# Patient Record
Sex: Female | Born: 1970
Health system: Southern US, Community
[De-identification: ages and names within clinical notes are randomized; demographics above are authoritative.]

## PROBLEM LIST (undated history)

## (undated) DIAGNOSIS — F329 Major depressive disorder, single episode, unspecified: Secondary | ICD-10-CM

## (undated) DIAGNOSIS — F32A Depression, unspecified: Secondary | ICD-10-CM

## (undated) DIAGNOSIS — Z87442 Personal history of urinary calculi: Secondary | ICD-10-CM

## (undated) DIAGNOSIS — F988 Other specified behavioral and emotional disorders with onset usually occurring in childhood and adolescence: Secondary | ICD-10-CM

## (undated) DIAGNOSIS — B0089 Other herpesviral infection: Secondary | ICD-10-CM

## (undated) DIAGNOSIS — F419 Anxiety disorder, unspecified: Secondary | ICD-10-CM

## (undated) DIAGNOSIS — IMO0002 Reserved for concepts with insufficient information to code with codable children: Secondary | ICD-10-CM

## (undated) DIAGNOSIS — R87619 Unspecified abnormal cytological findings in specimens from cervix uteri: Secondary | ICD-10-CM

## (undated) DIAGNOSIS — G43909 Migraine, unspecified, not intractable, without status migrainosus: Secondary | ICD-10-CM

## (undated) DIAGNOSIS — E785 Hyperlipidemia, unspecified: Secondary | ICD-10-CM

## (undated) DIAGNOSIS — N189 Chronic kidney disease, unspecified: Secondary | ICD-10-CM

## (undated) HISTORY — DX: Reserved for concepts with insufficient information to code with codable children: IMO0002

## (undated) HISTORY — DX: Migraine, unspecified, not intractable, without status migrainosus: G43.909

## (undated) HISTORY — DX: Other herpesviral infection: B00.89

## (undated) HISTORY — DX: Chronic kidney disease, unspecified: N18.9

## (undated) HISTORY — PX: ADENOIDECTOMY: SUR15

## (undated) HISTORY — DX: Hyperlipidemia, unspecified: E78.5

## (undated) HISTORY — DX: Unspecified abnormal cytological findings in specimens from cervix uteri: R87.619

## (undated) HISTORY — DX: Other specified behavioral and emotional disorders with onset usually occurring in childhood and adolescence: F98.8

---

## 1994-10-05 HISTORY — PX: LEEP: SHX91

## 1998-02-16 ENCOUNTER — Ambulatory Visit (HOSPITAL_COMMUNITY): Admission: RE | Admit: 1998-02-16 | Discharge: 1998-02-16 | Payer: Self-pay | Admitting: *Deleted

## 1998-11-05 ENCOUNTER — Other Ambulatory Visit: Admission: RE | Admit: 1998-11-05 | Discharge: 1998-11-05 | Payer: Self-pay | Admitting: *Deleted

## 1999-07-01 ENCOUNTER — Emergency Department (HOSPITAL_COMMUNITY): Admission: EM | Admit: 1999-07-01 | Discharge: 1999-07-01 | Payer: Self-pay | Admitting: Emergency Medicine

## 1999-07-01 ENCOUNTER — Encounter: Payer: Self-pay | Admitting: Emergency Medicine

## 1999-09-20 ENCOUNTER — Inpatient Hospital Stay (HOSPITAL_COMMUNITY): Admission: EM | Admit: 1999-09-20 | Discharge: 1999-09-22 | Payer: Self-pay | Admitting: Emergency Medicine

## 1999-09-23 ENCOUNTER — Other Ambulatory Visit (HOSPITAL_COMMUNITY): Admission: RE | Admit: 1999-09-23 | Discharge: 1999-10-27 | Payer: Self-pay | Admitting: Psychiatry

## 2001-01-18 ENCOUNTER — Other Ambulatory Visit: Admission: RE | Admit: 2001-01-18 | Discharge: 2001-01-18 | Payer: Self-pay | Admitting: Internal Medicine

## 2002-03-27 ENCOUNTER — Other Ambulatory Visit: Admission: RE | Admit: 2002-03-27 | Discharge: 2002-03-27 | Payer: Self-pay | Admitting: Internal Medicine

## 2002-10-24 ENCOUNTER — Encounter: Payer: Self-pay | Admitting: Emergency Medicine

## 2002-10-24 ENCOUNTER — Emergency Department (HOSPITAL_COMMUNITY): Admission: EM | Admit: 2002-10-24 | Discharge: 2002-10-24 | Payer: Self-pay | Admitting: Emergency Medicine

## 2003-05-14 ENCOUNTER — Other Ambulatory Visit: Admission: RE | Admit: 2003-05-14 | Discharge: 2003-05-14 | Payer: Self-pay | Admitting: Internal Medicine

## 2003-10-06 HISTORY — PX: TUBAL LIGATION: SHX77

## 2004-10-07 ENCOUNTER — Emergency Department (HOSPITAL_COMMUNITY): Admission: EM | Admit: 2004-10-07 | Discharge: 2004-10-07 | Payer: Self-pay | Admitting: Emergency Medicine

## 2004-11-06 ENCOUNTER — Other Ambulatory Visit: Admission: RE | Admit: 2004-11-06 | Discharge: 2004-11-06 | Payer: Self-pay | Admitting: Internal Medicine

## 2004-11-11 ENCOUNTER — Ambulatory Visit (HOSPITAL_COMMUNITY): Admission: RE | Admit: 2004-11-11 | Discharge: 2004-11-11 | Payer: Self-pay | Admitting: *Deleted

## 2005-09-10 IMAGING — CT CT ABDOMEN W/O CM
1 of 2 series · 15 of 32 positions shown, 19 images · non-contrast
Comparison: none

CLINICAL DATA: Right flank pain.
 CT OF THE ABDOMEN AND PELVIS WITHOUT CONTRAST, STONE PROTOCOL:
TECHNIQUE: Multidetector helical study performed without IV and without oral contrast with the renal stone protocol.
 CT ABDOMEN:
 There are no renal calculi.  There is no hydronephrosis.  There are no perinephric edematous changes.  The abdominal aorta is normal in size.  There is no adenopathy.

[Series 5: — · axial · 0.61mm/px · z∈[-492,-140]mm · 15 of 96 slices shown, 19 images]
[im 4/96  soft-tissue]
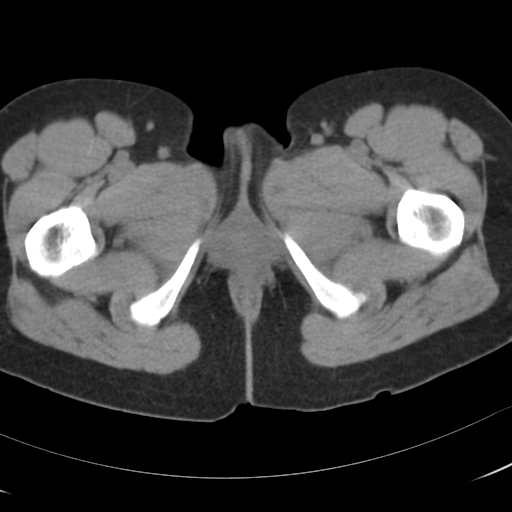
[im 4/96  bone]
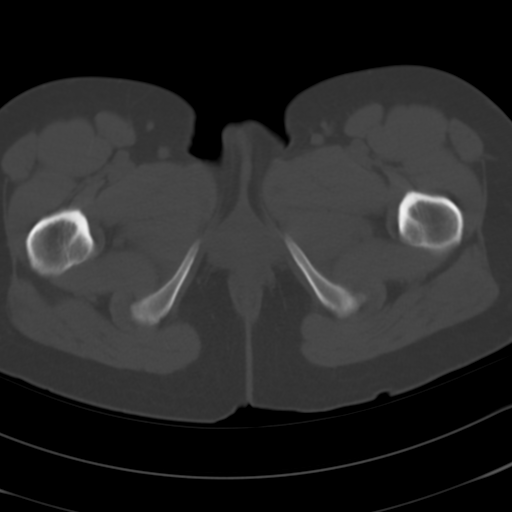
[im 11/96  soft-tissue]
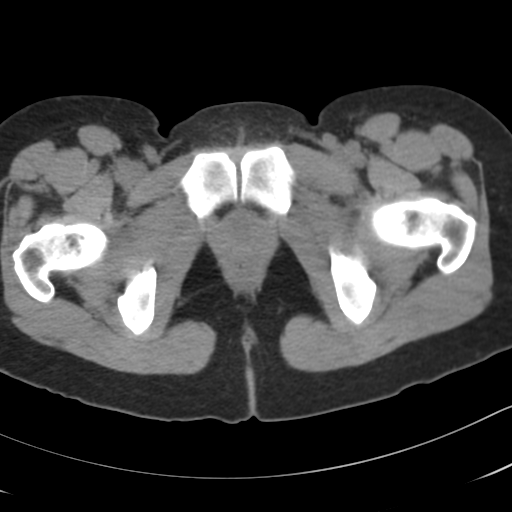
[im 19/96  soft-tissue]
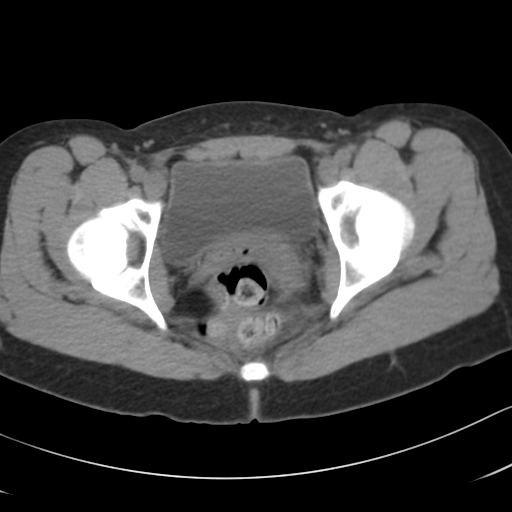
[im 26/96  soft-tissue]
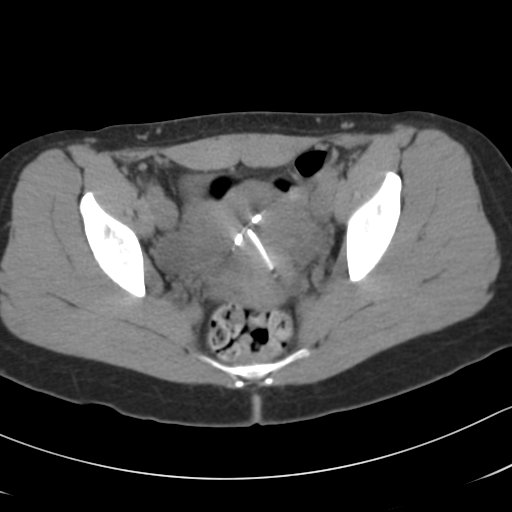
[im 33/96  soft-tissue]
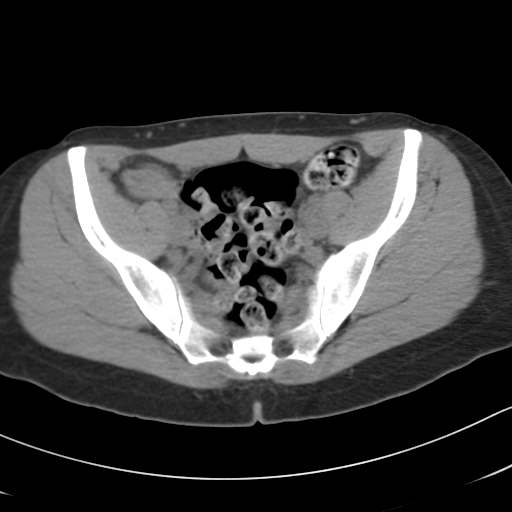
[im 41/96  soft-tissue]
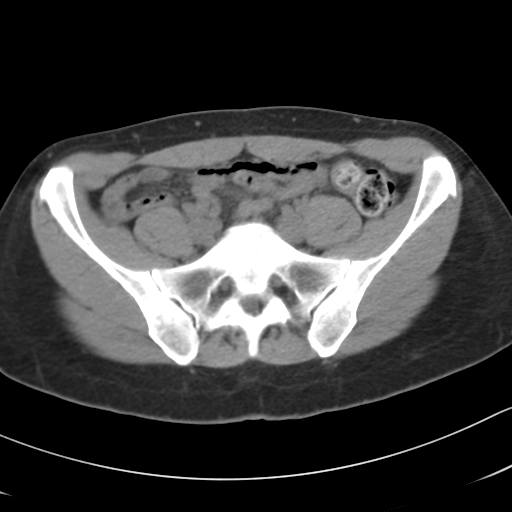
[im 48/96  soft-tissue]
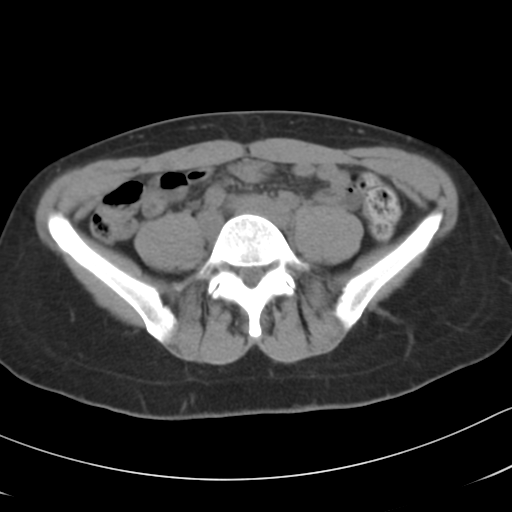
[im 55/96  soft-tissue]
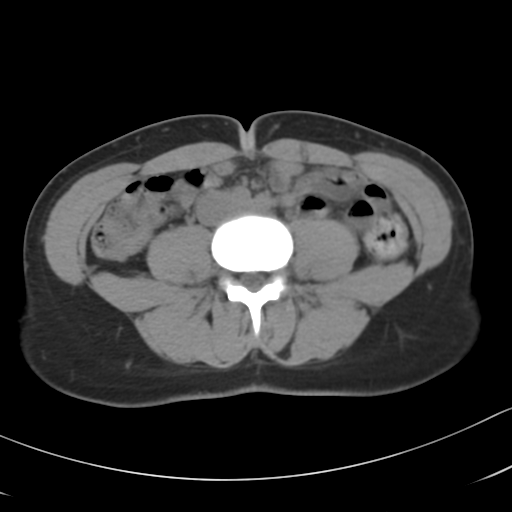
[im 63/96  soft-tissue]
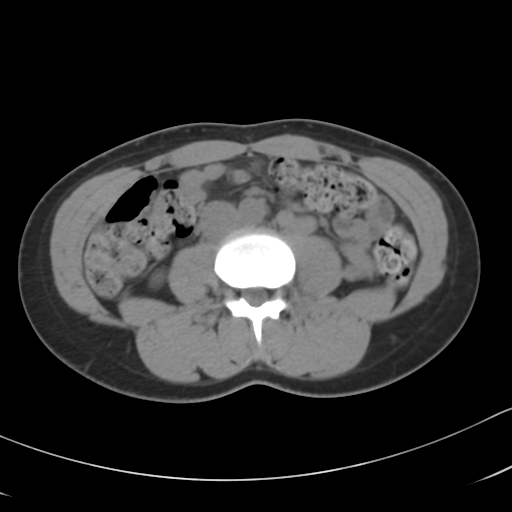
[im 63/96  bone]
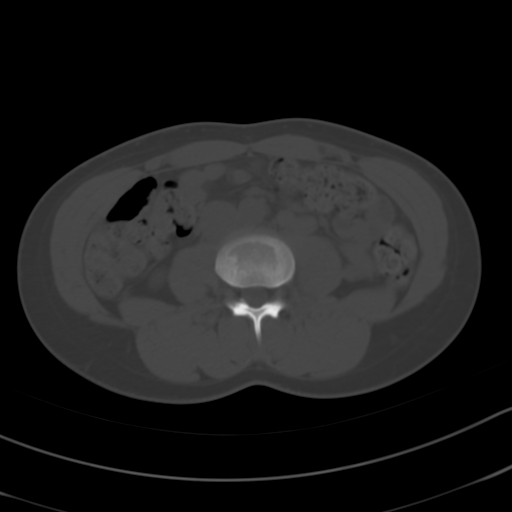
[im 70/96  soft-tissue]
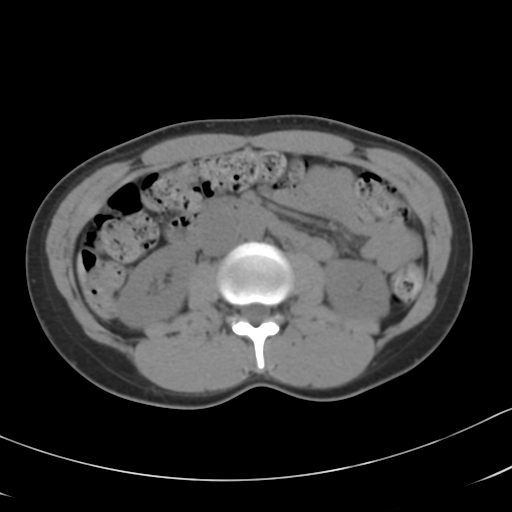
[im 77/96  soft-tissue]
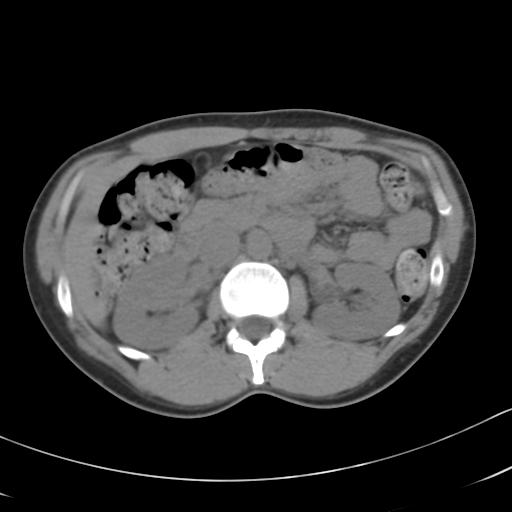
[im 81/96  lung]
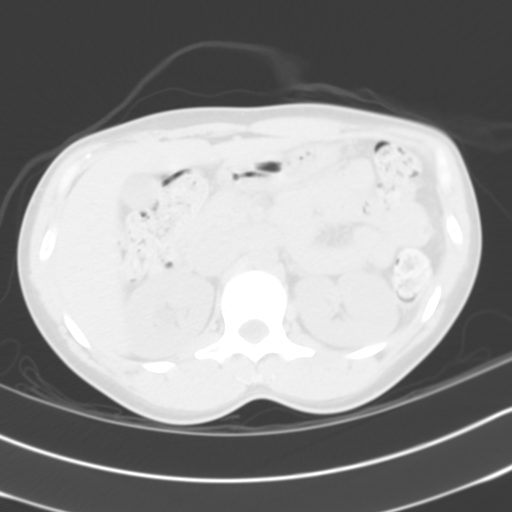
[im 85/96  soft-tissue]
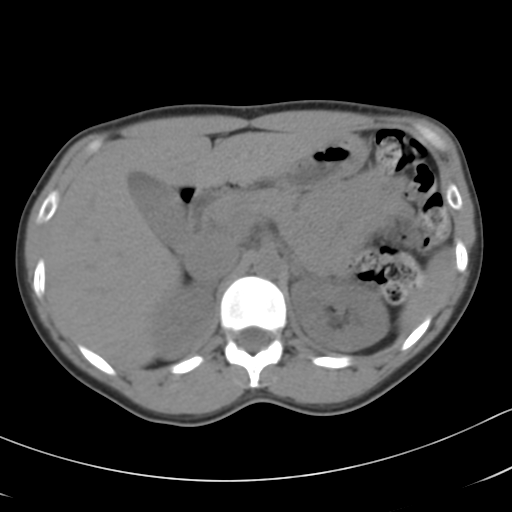
[im 85/96  lung]
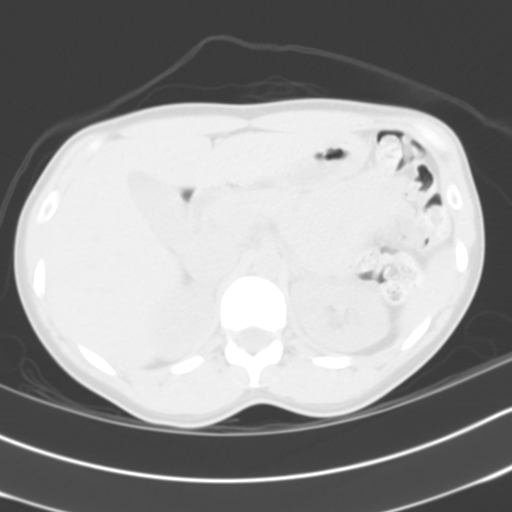
[im 88/96  lung]
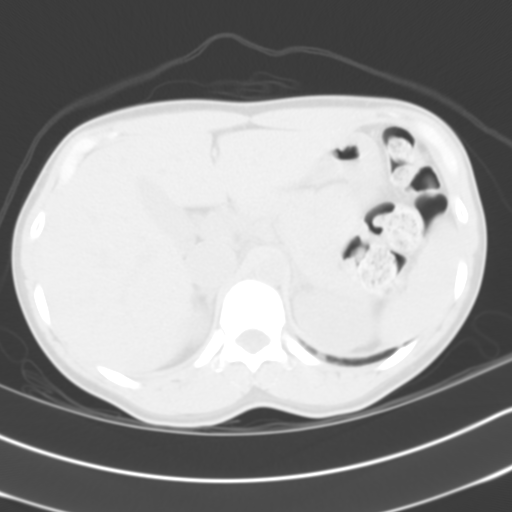
[im 92/96  soft-tissue]
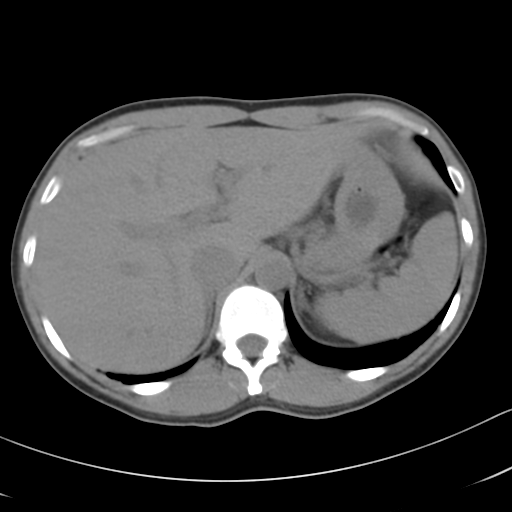
[im 92/96  lung]
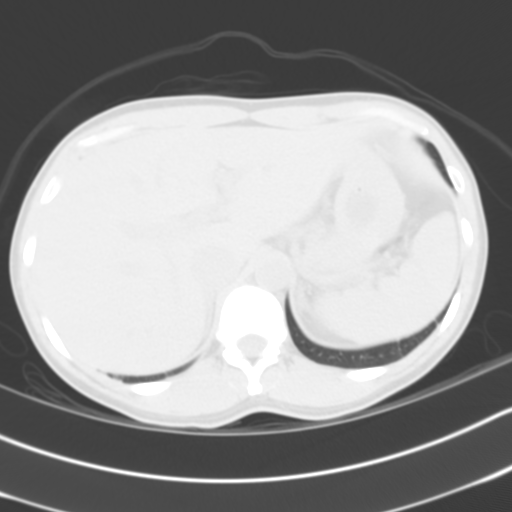

[15 of 32 positions shown; findings below may reference images not displayed]

IMPRESSION: Negative CT of the abdomen.
 CT PELVIS:
 There are several right pelvic calcifications which are rounded and most likely are separate from the ureter (phleboliths).  Contrast enhancement may be helpful to be certain that either of these does not represent a non-obstructing distal ureteral calculus.  There is an intrauterine device within the uterus.  There is no pelvic mass or adenopathy.
IMPRESSION: Several rounded right pelvic calcifications felt most likely to represent phleboliths.  IV contrast may be helpful to confirm these as separate from the ureter.

## 2006-07-05 ENCOUNTER — Other Ambulatory Visit: Admission: RE | Admit: 2006-07-05 | Discharge: 2006-07-05 | Payer: Self-pay | Admitting: Internal Medicine

## 2006-09-23 ENCOUNTER — Encounter: Admission: RE | Admit: 2006-09-23 | Discharge: 2006-09-23 | Payer: Self-pay | Admitting: Internal Medicine

## 2008-11-13 ENCOUNTER — Ambulatory Visit: Payer: Self-pay | Admitting: Internal Medicine

## 2009-04-19 ENCOUNTER — Ambulatory Visit: Payer: Self-pay | Admitting: Internal Medicine

## 2009-04-19 ENCOUNTER — Other Ambulatory Visit: Admission: RE | Admit: 2009-04-19 | Discharge: 2009-04-19 | Payer: Self-pay | Admitting: Internal Medicine

## 2009-04-19 LAB — HM PAP SMEAR

## 2009-08-16 ENCOUNTER — Ambulatory Visit: Payer: Self-pay | Admitting: Internal Medicine

## 2010-07-04 ENCOUNTER — Ambulatory Visit: Payer: Self-pay | Admitting: Internal Medicine

## 2010-10-26 ENCOUNTER — Encounter: Payer: Self-pay | Admitting: Internal Medicine

## 2011-02-20 NOTE — Op Note (Signed)
NAMEJOSANNA, HEFEL            ACCOUNT NO.:  [id number]   MEDICAL RECORD NO.:  [id number]          PATIENT TYPE:  AMB   LOCATION:  SDC                           FACILITY:  [facility]   PHYSICIAN:  Clarks B. Earlene Plater, M.D.  DATE OF BIRTH:  Feb 05, 1971   DATE OF PROCEDURE:  11/11/2004  DATE OF DISCHARGE:                                 OPERATIVE REPORT   PREOPERATIVE DIAGNOSIS:  Desires tubal sterilization.   POSTOPERATIVE DIAGNOSIS:  Desires tubal sterilization.   PROCEDURE:  Laparoscopic tubal sterilization.   SURGEON:  Chester Holstein. Earlene Plater, M.D.   ANESTHESIA:  General.   ASSISTANT:  None.   INDICATIONS FOR PROCEDURE:  The patient desires permanent sterilization.  Failure rate and ectopic risks discussed.  Operative risks discussed  including infection, bleeding, damage to bowel, bladder, or surrounding  organs.  The patient had previously been using an IUD and was not satisfied.  She also declined an Essure tubal sterilization and her husband had declined  vasectomy.   DESCRIPTION OF PROCEDURE:  The patient was taken to the operating room and  general anesthesia was obtained.  She was prepped and draped in the usual  sterile fashion and the bladder emptied with an in-and-out catheter.   A 10 mm incision made vertically in the umbilicus, carried sharply to the  fascia.  The fascia was elevated and entered sharply.  Posterior sheath and  peritoneum elevated and entered sharply.  Pursestring suture of 0 Vicryl was  placed around the fascial defect.  Hasson cannula inserted and secured.  The  pneumoperitoneum obtained with CO2 gas.   Trendelenburg incision obtained.  Uterus mobilized with the Hulka tenaculum  which was placed after the prep.  The right tube was identified and followed  to its fimbriated end.  It was cauterized with bipolar in a triple burn  fashion.  Repeated on the opposite side in a similar fashion.  The pelvis  otherwise appeared normal and the procedure was  terminated.   The scope was removed and gas released.  Hasson cannula was removed.  Abdominal wall elevated with S retractor.  Fascial defect closed with the  pursestring suture.  Skin closed with 4-0 Vicryl subcuticularly.   The Hulka was removed and cervix hemostatic.  The patient tolerated the  procedure well with no complications.  She was taken to the recovery room  awake and in stable condition.      WBD/MEDQ  D:  11/11/2004  T:  11/11/2004  Job:  [id number]

## 2011-08-20 ENCOUNTER — Other Ambulatory Visit: Payer: Self-pay | Admitting: Internal Medicine

## 2011-11-09 ENCOUNTER — Encounter: Payer: Self-pay | Admitting: Internal Medicine

## 2011-11-09 ENCOUNTER — Ambulatory Visit (INDEPENDENT_AMBULATORY_CARE_PROVIDER_SITE_OTHER): Payer: BC Managed Care – PPO | Admitting: Internal Medicine

## 2011-11-09 VITALS — BP 114/74 | HR 88 | Temp 98.6°F | Wt 172.0 lb

## 2011-11-09 DIAGNOSIS — F1021 Alcohol dependence, in remission: Secondary | ICD-10-CM

## 2011-11-09 DIAGNOSIS — F32A Depression, unspecified: Secondary | ICD-10-CM

## 2011-11-09 DIAGNOSIS — F329 Major depressive disorder, single episode, unspecified: Secondary | ICD-10-CM

## 2011-11-09 DIAGNOSIS — J309 Allergic rhinitis, unspecified: Secondary | ICD-10-CM

## 2011-11-09 DIAGNOSIS — R300 Dysuria: Secondary | ICD-10-CM

## 2011-11-09 LAB — POCT URINALYSIS DIPSTICK
Bilirubin, UA: NEGATIVE
Blood, UA: NEGATIVE
Glucose, UA: NEGATIVE
Nitrite, UA: NEGATIVE
Spec Grav, UA: 1.015
Urobilinogen, UA: NEGATIVE

## 2011-12-03 ENCOUNTER — Encounter: Payer: Self-pay | Admitting: Internal Medicine

## 2011-12-03 DIAGNOSIS — J309 Allergic rhinitis, unspecified: Secondary | ICD-10-CM | POA: Insufficient documentation

## 2011-12-03 NOTE — Patient Instructions (Signed)
Take antibiotics as prescribed. Take cough syrup sparingly. Take prednisone for inflammation and bronchitis.

## 2011-12-03 NOTE — Progress Notes (Signed)
  Subjective:    Patient ID: Courtney Cole, female    DOB: 1971/01/22, 41 y.o.   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 Gets depressed at times. Complaining of fatigue and prolonged coughing. Has been coughing for several weeks. Is accompanied by her mother today. History of migraine headaches, herpes simplex type I, history of abnormal Pap smear in 1996. Had LEEP procedure for that. History of IUD for birth control. Sees Dr. Milagros Evener for depression. Complained of severe fatigue September 2011. Workup was normal. History of allergic rhinitis. Was tested by Dr. Lavinia Sharps as August 2008 and found to be allergic to mold.  Divorced no children. In 1997 had mood swings, obsessive-compulsive behavior about exercise and eating. At that time had ended relationship with a married man. Has been a patient in this practice since 1990. Saw Dr. Neale Burly in 2006 for evaluation of headaches. Hospitalized 09/20/1999 with alcohol intoxication and overdose of Celexa.  Mother with history of hypertension, hyperlipidemia, diabetes.    Review of Systems     Objective:   Physical Exam HEENT exam: TMs are slightly full bilaterally. Pharynx is clear. Neck is supple without significant adenopathy or thyromegaly. Chest clear.        Assessment & Plan:  Bronchitis  Fatigue  Plan: Sterapred DS 10 mg 6 day dosepak. Biaxin 500 mg by mouth twice daily for 10 days. Hycodan 8 ounces 1 teaspoon by mouth every 6 hours when necessary cough.

## 2011-12-07 ENCOUNTER — Ambulatory Visit (INDEPENDENT_AMBULATORY_CARE_PROVIDER_SITE_OTHER): Payer: BC Managed Care – PPO | Admitting: Internal Medicine

## 2011-12-07 ENCOUNTER — Encounter: Payer: Self-pay | Admitting: Internal Medicine

## 2011-12-07 VITALS — BP 118/86 | HR 76 | Temp 97.1°F | Ht 64.0 in | Wt 171.0 lb

## 2011-12-07 DIAGNOSIS — Z23 Encounter for immunization: Secondary | ICD-10-CM

## 2011-12-07 DIAGNOSIS — F1011 Alcohol abuse, in remission: Secondary | ICD-10-CM

## 2011-12-07 DIAGNOSIS — Z8659 Personal history of other mental and behavioral disorders: Secondary | ICD-10-CM

## 2011-12-07 DIAGNOSIS — Z124 Encounter for screening for malignant neoplasm of cervix: Secondary | ICD-10-CM

## 2011-12-07 DIAGNOSIS — Z8669 Personal history of other diseases of the nervous system and sense organs: Secondary | ICD-10-CM

## 2011-12-07 DIAGNOSIS — J309 Allergic rhinitis, unspecified: Secondary | ICD-10-CM

## 2011-12-07 DIAGNOSIS — Z0000 Encounter for general adult medical examination without abnormal findings: Secondary | ICD-10-CM

## 2011-12-07 LAB — POCT URINALYSIS DIPSTICK
Glucose, UA: NEGATIVE
Leukocytes, UA: NEGATIVE
Nitrite, UA: NEGATIVE
Protein, UA: NEGATIVE
Spec Grav, UA: 1.01
Urobilinogen, UA: NEGATIVE

## 2011-12-07 NOTE — Patient Instructions (Signed)
He had been given tetanus immunization update today. Return in one year or as needed. Please get yearly mammogram.

## 2011-12-08 ENCOUNTER — Other Ambulatory Visit (HOSPITAL_COMMUNITY)
Admission: RE | Admit: 2011-12-08 | Discharge: 2011-12-08 | Disposition: A | Discharge status: Home / Self Care | Payer: BC Managed Care – PPO | Source: Ambulatory Visit | Attending: Internal Medicine | Admitting: Internal Medicine

## 2011-12-08 DIAGNOSIS — Z01419 Encounter for gynecological examination (general) (routine) without abnormal findings: Secondary | ICD-10-CM | POA: Insufficient documentation

## 2011-12-08 LAB — LIPID PANEL: Total CHOL/HDL Ratio: 2.9 Ratio

## 2011-12-08 LAB — CBC WITH DIFFERENTIAL/PLATELET
Basophils Absolute: 0 10*3/uL (ref 0.0–0.1)
Eosinophils Relative: 1 % (ref 0–5)
Lymphocytes Relative: 31 % (ref 12–46)
Lymphs Abs: 1.5 10*3/uL (ref 0.7–4.0)
Neutro Abs: 2.8 10*3/uL (ref 1.7–7.7)
Neutrophils Relative %: 57 % (ref 43–77)
Platelets: 300 10*3/uL (ref 150–400)
RBC: 4.07 MIL/uL (ref 3.87–5.11)
RDW: 12.6 % (ref 11.5–15.5)
WBC: 4.9 10*3/uL (ref 4.0–10.5)

## 2011-12-08 LAB — COMPREHENSIVE METABOLIC PANEL
ALT: 13 U/L (ref 0–35)
AST: 19 U/L (ref 0–37)
CO2: 22 mEq/L (ref 19–32)
Calcium: 9.8 mg/dL (ref 8.4–10.5)
Chloride: 104 mEq/L (ref 96–112)
Creat: 0.81 mg/dL (ref 0.50–1.10)
Potassium: 4.1 mEq/L (ref 3.5–5.3)
Sodium: 137 mEq/L (ref 135–145)
Total Protein: 7.4 g/dL (ref 6.0–8.3)

## 2011-12-08 LAB — VITAMIN D 25 HYDROXY (VIT D DEFICIENCY, FRACTURES): Vit D, 25-Hydroxy: 50 ng/mL (ref 30–89)

## 2011-12-08 LAB — TSH: TSH: 0.922 u[IU]/mL (ref 0.350–4.500)

## 2011-12-09 NOTE — Progress Notes (Signed)
  Subjective:    Patient ID: Courtney Cole, female    DOB: 1971/09/20, 41 y.o.   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 Recently here with respiratory infection. That has resolved. Has had migraine headaches and herpes simplex type I. History of abnormal Pap smear in 1996 status post a LEEP procedure. Psychiatrist is Dr. Evelene Croon. History of depression. Patient's first and only marriage ended in divorce. At that time she was trying very hard in the marriage and a stepdaughter caused lots of problems in the marriage. She and her accipiter still friends. She now has a new female partner. She is sexually active. History of allergic rhinitis. Allergy tested by Dr. Beaulah Dinning is August 2008. She was ambulatory to mold. Also thought to have an element of GE reflux and was placed on Prilosec. Also was placed on albuterol, Astelin nasal spray and Nasonex nasal spray. From a tree was normal with Dr. Beaulah Dinning with no changes using bronchodilator in 2008. In 2006 saw Dr. Santiago Glad for headaches and was placed on Topamax at that time.  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 No history of serious illnesses accidents. Hospitalized December 2000 alcohol and drug overdose. Upon discharge was referred to outpatient psychiatric program. Is also attended Alcoholics Anonymous.  Family history mother with history of hypertension hyperlipidemia diabetes mellitus and obesity urticaria and depression. Father with history of coronary artery disease and MI. No siblings.    Review of Systems  Constitutional: Negative.   HENT: Negative.   Eyes: Negative.   Cardiovascular: Negative.   Gastrointestinal: Negative.   Genitourinary: Negative.   Musculoskeletal: Negative.   Neurological: Negative.   Hematological: Negative.   Psychiatric/Behavioral: Negative.          Objective:   Physical Exam  Nursing note and vitals reviewed. Constitutional: She is oriented to person, place, and time. She appears well-developed and well-nourished.  HENT:  Head: Normocephalic and atraumatic.  Right Ear: External ear normal.  Left Ear: External ear normal.  Mouth/Throat: Oropharynx is clear and moist.  Eyes: Conjunctivae and EOM are normal. Pupils are equal, round, and reactive to light. Right eye exhibits no discharge. Left eye exhibits no discharge. No scleral icterus.  Neck: Neck supple. No thyromegaly present.  Cardiovascular: Normal rate, normal heart sounds and intact distal pulses.   No murmur heard. Pulmonary/Chest:       Breasts normal female  Abdominal: Soft. Bowel sounds are normal. She exhibits no distension. There is no rebound and no guarding.  Genitourinary:       Pap taken bimanual normal  Musculoskeletal: She exhibits no edema.  Lymphadenopathy:    She has no cervical adenopathy.  Neurological: She is alert and oriented to person, place, and time. She has normal reflexes. She displays normal reflexes. No cranial nerve deficit.  Skin: Skin is warm and dry. No rash noted.  Psychiatric: She has a normal mood and affect. Her behavior is normal. Judgment and thought content normal.          Assessment & Plan:  Normal health maintenance exam  History of depression  History of migraine headaches history of abnormal Pap smear 1996 status post LEEP procedure and no abnormal paps since then  Recovering alcoholic  Allergic rhinitis  History of herpes simplex type I  Plan: Return one year or as needed. Continue to see psychiatrist for depression.

## 2011-12-23 ENCOUNTER — Ambulatory Visit (INDEPENDENT_AMBULATORY_CARE_PROVIDER_SITE_OTHER): Payer: BC Managed Care – PPO | Admitting: Internal Medicine

## 2011-12-23 ENCOUNTER — Encounter: Payer: Self-pay | Admitting: Internal Medicine

## 2011-12-23 VITALS — Wt 172.0 lb

## 2011-12-23 DIAGNOSIS — M5412 Radiculopathy, cervical region: Secondary | ICD-10-CM

## 2011-12-23 NOTE — Patient Instructions (Signed)
Take Sterapred DS 10 mg 12 day dosepak as prescribed. Use Flexeril at night as a muscle relaxant. Take Vicodin sparingly for pain.

## 2011-12-23 NOTE — Progress Notes (Signed)
  Subjective:    Patient ID: Courtney Cole, female    DOB: 21-Dec-1970, 41 y.o.   MRN: [id number]  HPI Onset 10 days ago of left shoulder pain. Pain would radiate down into her upper left humerus. Doesn't recall any overexertion or injury to that area. Subsequently a couple of days ago began to experience numbness in her left index finger. At times now has numbness of all fingers in the left hand. Never had neck issues before. She is right-handed. 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 Just awakened one day with the shoulder pain.    Review of Systems     Objective:   Physical Exam she has pain in her left paracervical neck area extending toward her left shoulder with palpation. Deep tendon reflexes are 2+ and symmetrical in the left upper cavity. Muscle strength is 5 over 5 in the left upper cavity. At this time, she has sensation in all 5 fingers.        Assessment & Plan:  Cervical radiculopathy  Plan: Sterapred DS 10 mg 12 day dosepak. Flexeril 10 mg (#30) 1 by mouth each bedtime. Vicodin 5/500 (#40) 1 by mouth Q4 to 6 hours when necessary pain no refill. Patient is to return for reevaluation in 10 days to 2 weeks or sooner if worse.

## 2012-01-07 ENCOUNTER — Encounter: Payer: Self-pay | Admitting: Internal Medicine

## 2012-01-07 ENCOUNTER — Ambulatory Visit (INDEPENDENT_AMBULATORY_CARE_PROVIDER_SITE_OTHER): Payer: BC Managed Care – PPO | Admitting: Internal Medicine

## 2012-01-07 VITALS — BP 130/82 | HR 84 | Temp 98.3°F | Wt 172.0 lb

## 2012-01-07 DIAGNOSIS — M5412 Radiculopathy, cervical region: Secondary | ICD-10-CM

## 2012-01-08 NOTE — Patient Instructions (Addendum)
We will arrange for MRI of the cervical spine.

## 2012-01-08 NOTE — Progress Notes (Signed)
  Subjective:    Patient ID: Courtney Cole, female    DOB: 11-Nov-1970, 41 y.o.   MRN: [id number]  HPI patient was here recently complaining of pain in her left neck and shoulder area radiating into her left arm with some numbness of her fingers. She took a prednisone dosepak, Flexeril and Vicodin without relief. Now says that all 5 fingers are none. Says pain starts in her left paracervical area radiating down her left shoulder into her left upper arm. Says pain is fairly severe and that nothing has really helped. She does not have weakness in her left upper extremity. Not dropping objects.    Review of Systems     Objective:   Physical Exam deep tendon reflexes in the left upper extremity 2+ and symmetrical. Muscle strength is normal in the left upper extremity. Decreased sensation in the fingers to light touch.        Assessment & Plan:  Left upper extremity radiculopathy  Neck pain  Plan: Patient is to have an MRI of the cervical spine to see if she has nerve root impingement in C-spine causing the symptoms.

## 2012-01-14 ENCOUNTER — Ambulatory Visit
Admission: RE | Admit: 2012-01-14 | Discharge: 2012-01-14 | Disposition: A | Discharge status: Home / Self Care | Payer: BC Managed Care – PPO | Source: Ambulatory Visit | Attending: Internal Medicine | Admitting: Internal Medicine

## 2012-01-14 DIAGNOSIS — M5412 Radiculopathy, cervical region: Secondary | ICD-10-CM

## 2012-01-14 NOTE — Progress Notes (Signed)
Patient spoke with Dr. Lenord Fellers will send referral to Dr. Venetia Maxon.

## 2012-01-17 ENCOUNTER — Other Ambulatory Visit: Payer: Self-pay | Admitting: Internal Medicine

## 2012-01-28 ENCOUNTER — Other Ambulatory Visit: Payer: Self-pay | Admitting: Neurosurgery

## 2012-01-29 ENCOUNTER — Encounter (HOSPITAL_COMMUNITY): Payer: Self-pay | Admitting: Respiratory Therapy

## 2012-02-09 ENCOUNTER — Encounter (HOSPITAL_COMMUNITY): Payer: Self-pay

## 2012-02-09 ENCOUNTER — Encounter (HOSPITAL_COMMUNITY)
Admission: RE | Admit: 2012-02-09 | Discharge: 2012-02-09 | Disposition: A | Discharge status: Home / Self Care | Payer: BC Managed Care – PPO | Source: Ambulatory Visit | Attending: Neurosurgery | Admitting: Neurosurgery

## 2012-02-09 HISTORY — DX: Major depressive disorder, single episode, unspecified: F32.9

## 2012-02-09 HISTORY — DX: Anxiety disorder, unspecified: F41.9

## 2012-02-09 HISTORY — DX: Depression, unspecified: F32.A

## 2012-02-09 HISTORY — DX: Personal history of urinary calculi: Z87.442

## 2012-02-09 LAB — CBC
HCT: 38.1 % (ref 36.0–46.0)
Hemoglobin: 13 g/dL (ref 12.0–15.0)
MCH: 33.2 pg (ref 26.0–34.0)
MCHC: 34.1 g/dL (ref 30.0–36.0)
MCV: 97.2 fL (ref 78.0–100.0)
RBC: 3.92 MIL/uL (ref 3.87–5.11)

## 2012-02-09 NOTE — Pre-Procedure Instructions (Signed)
18 Courtney Cole  02/09/2012   Your procedure is scheduled on:  Thursday, May 9  Report to [facility] at 0815 AM.  Call this number if you have problems the morning of surgery: [phone]   Remember:   Do not eat food:After Midnight.  May have clear liquids: up to 4 Hours before arrival.(4:15 am)  Clear liquids include soda, tea, black coffee, apple or grape juice, broth.  Take these medicines the morning of surgery with A SIP OF WATER: *Bupropion,Hydrocodone,Vyvanse,Valtrex, Vybrid**   Do not wear jewelry, make-up or nail polish.  Do not wear lotions, powders, or perfumes. You may wear deodorant.  Do not shave 48 hours prior to surgery.  Do not bring valuables to the hospital.  Contacts, dentures or bridgework may not be worn into surgery.  Leave suitcase in the car. After surgery it may be brought to your room.  For patients admitted to the hospital, checkout time is 11:00 AM the day of discharge.   Patients discharged the day of surgery will not be allowed to drive home.  Name and phone number of your driver: *n/a**  Special Instructions: CHG Shower Use Special Wash: 1/2 bottle night before surgery and 1/2 bottle morning of surgery.   Please read over the following fact sheets that you were given: Pain Booklet, Coughing and Deep Breathing, MRSA Information and Surgical Site Infection Prevention

## 2012-02-10 MED ORDER — CEFAZOLIN SODIUM-DEXTROSE 2-3 GM-% IV SOLR
2.0000 g | INTRAVENOUS | Status: AC
  Administered 2012-02-11: 2 g via INTRAVENOUS
  Filled 2012-02-10: qty 50

## 2012-02-11 ENCOUNTER — Ambulatory Visit (HOSPITAL_COMMUNITY): Payer: BC Managed Care – PPO

## 2012-02-11 ENCOUNTER — Encounter (HOSPITAL_COMMUNITY): Payer: Self-pay | Admitting: Anesthesiology

## 2012-02-11 ENCOUNTER — Encounter (HOSPITAL_COMMUNITY): Payer: Self-pay | Admitting: Surgery

## 2012-02-11 ENCOUNTER — Observation Stay (HOSPITAL_COMMUNITY)
Admission: RE | Admit: 2012-02-11 | Discharge: 2012-02-12 | DRG: 865 | Disposition: A | Discharge status: Home / Self Care | Payer: BC Managed Care – PPO | Source: Ambulatory Visit | Attending: Neurosurgery | Admitting: Neurosurgery

## 2012-02-11 ENCOUNTER — Ambulatory Visit (HOSPITAL_COMMUNITY): Payer: BC Managed Care – PPO | Admitting: Anesthesiology

## 2012-02-11 ENCOUNTER — Encounter (HOSPITAL_COMMUNITY)
Admission: RE | Disposition: A | Discharge status: Home / Self Care | Payer: Self-pay | Source: Ambulatory Visit | Attending: Neurosurgery

## 2012-02-11 DIAGNOSIS — M47812 Spondylosis without myelopathy or radiculopathy, cervical region: Secondary | ICD-10-CM | POA: Insufficient documentation

## 2012-02-11 DIAGNOSIS — F3289 Other specified depressive episodes: Secondary | ICD-10-CM | POA: Insufficient documentation

## 2012-02-11 DIAGNOSIS — F329 Major depressive disorder, single episode, unspecified: Secondary | ICD-10-CM | POA: Insufficient documentation

## 2012-02-11 DIAGNOSIS — Z01812 Encounter for preprocedural laboratory examination: Secondary | ICD-10-CM | POA: Insufficient documentation

## 2012-02-11 DIAGNOSIS — M5020 Other cervical disc displacement, unspecified cervical region: Principal | ICD-10-CM | POA: Insufficient documentation

## 2012-02-11 DIAGNOSIS — F411 Generalized anxiety disorder: Secondary | ICD-10-CM | POA: Insufficient documentation

## 2012-02-11 DIAGNOSIS — M5030 Other cervical disc degeneration, unspecified cervical region: Secondary | ICD-10-CM | POA: Insufficient documentation

## 2012-02-11 HISTORY — PX: ANTERIOR CERVICAL DECOMP/DISCECTOMY FUSION: SHX1161

## 2012-02-11 SURGERY — ANTERIOR CERVICAL DECOMPRESSION/DISCECTOMY FUSION 2 LEVELS
Anesthesia: General

## 2012-02-11 MED ORDER — PROPOFOL 10 MG/ML IV BOLUS
INTRAVENOUS | Status: DC | PRN
  Administered 2012-02-11: 200 mg via INTRAVENOUS

## 2012-02-11 MED ORDER — THROMBIN 5000 UNITS EX KIT
PACK | CUTANEOUS | Status: DC | PRN
  Administered 2012-02-11 (×2): 5000 [IU] via TOPICAL

## 2012-02-11 MED ORDER — ONDANSETRON HCL 4 MG/2ML IJ SOLN
INTRAMUSCULAR | Status: DC | PRN
  Administered 2012-02-11: 4 mg via INTRAVENOUS

## 2012-02-11 MED ORDER — HYDROCODONE-ACETAMINOPHEN 5-325 MG PO TABS: 1.0000 | ORAL_TABLET | ORAL | Status: DC | PRN

## 2012-02-11 MED ORDER — OXYCODONE-ACETAMINOPHEN 5-325 MG PO TABS
ORAL_TABLET | ORAL | Status: AC
  Filled 2012-02-11: qty 2

## 2012-02-11 MED ORDER — ARTIFICIAL TEARS OP OINT
TOPICAL_OINTMENT | OPHTHALMIC | Status: DC | PRN
  Administered 2012-02-11: 1 via OPHTHALMIC

## 2012-02-11 MED ORDER — FENTANYL CITRATE 0.05 MG/ML IJ SOLN
INTRAMUSCULAR | Status: DC | PRN
  Administered 2012-02-11: 100 ug via INTRAVENOUS
  Administered 2012-02-11 (×2): 50 ug via INTRAVENOUS
  Administered 2012-02-11: 100 ug via INTRAVENOUS
  Administered 2012-02-11: 50 ug via INTRAVENOUS

## 2012-02-11 MED ORDER — ROCURONIUM BROMIDE 100 MG/10ML IV SOLN
INTRAVENOUS | Status: DC | PRN
  Administered 2012-02-11: 50 mg via INTRAVENOUS

## 2012-02-11 MED ORDER — ACETAMINOPHEN 650 MG RE SUPP: 650.0000 mg | RECTAL | Status: DC | PRN

## 2012-02-11 MED ORDER — LIDOCAINE HCL (CARDIAC) 20 MG/ML IV SOLN
INTRAVENOUS | Status: DC | PRN
  Administered 2012-02-11: 80 mg via INTRAVENOUS

## 2012-02-11 MED ORDER — VILAZODONE HCL 20 MG PO TABS: 20.0000 mg | ORAL_TABLET | Freq: Every day | ORAL | Status: DC

## 2012-02-11 MED ORDER — HYDROMORPHONE HCL PF 1 MG/ML IJ SOLN
0.2500 mg | INTRAMUSCULAR | Status: DC | PRN
  Administered 2012-02-11 (×4): 0.5 mg via INTRAVENOUS

## 2012-02-11 MED ORDER — NEOSTIGMINE METHYLSULFATE 1 MG/ML IJ SOLN
INTRAMUSCULAR | Status: DC | PRN
  Administered 2012-02-11: 4 mg via INTRAVENOUS

## 2012-02-11 MED ORDER — ONDANSETRON HCL 4 MG/2ML IJ SOLN: 4.0000 mg | Freq: Once | INTRAMUSCULAR | Status: DC | PRN

## 2012-02-11 MED ORDER — LIDOCAINE HCL (CARDIAC) 20 MG/ML IV SOLN: INTRAVENOUS | Status: DC | PRN

## 2012-02-11 MED ORDER — DEXAMETHASONE SODIUM PHOSPHATE 10 MG/ML IJ SOLN
INTRAMUSCULAR | Status: DC | PRN
  Administered 2012-02-11: 10 mg via INTRAVENOUS

## 2012-02-11 MED ORDER — HYDROMORPHONE HCL PF 1 MG/ML IJ SOLN
INTRAMUSCULAR | Status: AC
  Administered 2012-02-11: 0.5 mg via INTRAVENOUS
  Filled 2012-02-11: qty 1

## 2012-02-11 MED ORDER — MUPIROCIN 2 % EX OINT
TOPICAL_OINTMENT | Freq: Two times a day (BID) | CUTANEOUS | Status: DC
  Administered 2012-02-11: 22:00:00 via NASAL
  Filled 2012-02-11: qty 22

## 2012-02-11 MED ORDER — SODIUM CHLORIDE 0.9 % IJ SOLN: 3.0000 mL | INTRAMUSCULAR | Status: DC | PRN

## 2012-02-11 MED ORDER — DIAZEPAM 5 MG PO TABS
ORAL_TABLET | ORAL | Status: AC
  Administered 2012-02-11: 5 mg via ORAL
  Filled 2012-02-11: qty 1

## 2012-02-11 MED ORDER — ZOLPIDEM TARTRATE 5 MG PO TABS: 10.0000 mg | ORAL_TABLET | Freq: Every evening | ORAL | Status: DC | PRN

## 2012-02-11 MED ORDER — MORPHINE SULFATE 2 MG/ML IJ SOLN
1.0000 mg | INTRAMUSCULAR | Status: DC | PRN
  Administered 2012-02-11 – 2012-02-12 (×3): 4 mg via INTRAVENOUS
  Filled 2012-02-11 (×3): qty 2

## 2012-02-11 MED ORDER — KCL IN DEXTROSE-NACL 20-5-0.45 MEQ/L-%-% IV SOLN
INTRAVENOUS | Status: DC
  Administered 2012-02-11: 14:00:00 via INTRAVENOUS
  Filled 2012-02-11 (×3): qty 1000

## 2012-02-11 MED ORDER — KCL IN DEXTROSE-NACL 20-5-0.45 MEQ/L-%-% IV SOLN
INTRAVENOUS | Status: AC
  Filled 2012-02-11: qty 1000

## 2012-02-11 MED ORDER — BUPROPION HBR ER 522 MG PO TB24: 522.0000 mg | ORAL_TABLET | Freq: Every day | ORAL | Status: DC

## 2012-02-11 MED ORDER — HEMOSTATIC AGENTS (NO CHARGE) OPTIME
TOPICAL | Status: DC | PRN
  Administered 2012-02-11: 1 via TOPICAL

## 2012-02-11 MED ORDER — GLYCOPYRROLATE 0.2 MG/ML IJ SOLN
INTRAMUSCULAR | Status: DC | PRN
  Administered 2012-02-11: 0.6 mg via INTRAVENOUS

## 2012-02-11 MED ORDER — DOCUSATE SODIUM 100 MG PO CAPS
100.0000 mg | ORAL_CAPSULE | Freq: Two times a day (BID) | ORAL | Status: DC
  Administered 2012-02-11: 100 mg via ORAL
  Filled 2012-02-11: qty 1

## 2012-02-11 MED ORDER — LISDEXAMFETAMINE DIMESYLATE 70 MG PO CAPS: 70.0000 mg | ORAL_CAPSULE | Freq: Every morning | ORAL | Status: DC

## 2012-02-11 MED ORDER — OXYCODONE-ACETAMINOPHEN 5-325 MG PO TABS
1.0000 | ORAL_TABLET | ORAL | Status: DC | PRN
  Administered 2012-02-11 – 2012-02-12 (×3): 2 via ORAL
  Filled 2012-02-11 (×2): qty 2

## 2012-02-11 MED ORDER — LACTATED RINGERS IV SOLN
INTRAVENOUS | Status: DC | PRN
  Administered 2012-02-11 (×2): via INTRAVENOUS

## 2012-02-11 MED ORDER — LIDOCAINE HCL 4 % MT SOLN
OROMUCOSAL | Status: DC | PRN
  Administered 2012-02-11: 4 mL via TOPICAL

## 2012-02-11 MED ORDER — PANTOPRAZOLE SODIUM 40 MG IV SOLR
40.0000 mg | Freq: Every day | INTRAVENOUS | Status: DC
  Administered 2012-02-11: 40 mg via INTRAVENOUS
  Filled 2012-02-11 (×2): qty 40

## 2012-02-11 MED ORDER — CEFAZOLIN SODIUM 1-5 GM-% IV SOLN
1.0000 g | Freq: Three times a day (TID) | INTRAVENOUS | Status: AC
  Administered 2012-02-11 – 2012-02-12 (×2): 1 g via INTRAVENOUS
  Filled 2012-02-11 (×3): qty 50

## 2012-02-11 MED ORDER — SODIUM CHLORIDE 0.9 % IV SOLN
INTRAVENOUS | Status: AC
  Filled 2012-02-11: qty 500

## 2012-02-11 MED ORDER — 0.9 % SODIUM CHLORIDE (POUR BTL) OPTIME
TOPICAL | Status: DC | PRN
  Administered 2012-02-11: 1000 mL

## 2012-02-11 MED ORDER — BACITRACIN 50000 UNITS IM SOLR
INTRAMUSCULAR | Status: AC
  Filled 2012-02-11: qty 1

## 2012-02-11 MED ORDER — ONDANSETRON HCL 4 MG/2ML IJ SOLN: 4.0000 mg | INTRAMUSCULAR | Status: DC | PRN

## 2012-02-11 MED ORDER — MENTHOL 3 MG MT LOZG
1.0000 | LOZENGE | OROMUCOSAL | Status: DC | PRN
  Filled 2012-02-11: qty 9

## 2012-02-11 MED ORDER — BUPIVACAINE HCL (PF) 0.5 % IJ SOLN
INTRAMUSCULAR | Status: DC | PRN
  Administered 2012-02-11: 6 mL

## 2012-02-11 MED ORDER — SODIUM CHLORIDE 0.9 % IJ SOLN
3.0000 mL | Freq: Two times a day (BID) | INTRAMUSCULAR | Status: DC
  Administered 2012-02-11: 3 mL via INTRAVENOUS

## 2012-02-11 MED ORDER — SODIUM CHLORIDE 0.9 % IR SOLN
Status: DC | PRN
  Administered 2012-02-11: 12:00:00

## 2012-02-11 MED ORDER — ALUM & MAG HYDROXIDE-SIMETH 200-200-20 MG/5ML PO SUSP: 30.0000 mL | Freq: Four times a day (QID) | ORAL | Status: DC | PRN

## 2012-02-11 MED ORDER — MIDAZOLAM HCL 5 MG/5ML IJ SOLN
INTRAMUSCULAR | Status: DC | PRN
  Administered 2012-02-11: 2 mg via INTRAVENOUS

## 2012-02-11 MED ORDER — DIAZEPAM 5 MG PO TABS
5.0000 mg | ORAL_TABLET | Freq: Four times a day (QID) | ORAL | Status: DC | PRN
  Administered 2012-02-11 – 2012-02-12 (×3): 5 mg via ORAL
  Filled 2012-02-11 (×2): qty 1

## 2012-02-11 MED ORDER — ALPRAZOLAM 0.5 MG PO TABS: 1.0000 mg | ORAL_TABLET | Freq: Every evening | ORAL | Status: DC | PRN

## 2012-02-11 MED ORDER — VALACYCLOVIR HCL 500 MG PO TABS
500.0000 mg | ORAL_TABLET | Freq: Every day | ORAL | Status: DC
  Filled 2012-02-11: qty 1

## 2012-02-11 MED ORDER — LIDOCAINE-EPINEPHRINE 1 %-1:100000 IJ SOLN
INTRAMUSCULAR | Status: DC | PRN
  Administered 2012-02-11: 6 mL

## 2012-02-11 MED ORDER — ACETAMINOPHEN 325 MG PO TABS: 650.0000 mg | ORAL_TABLET | ORAL | Status: DC | PRN

## 2012-02-11 MED ORDER — PHENOL 1.4 % MT LIQD
1.0000 | OROMUCOSAL | Status: DC | PRN
  Filled 2012-02-11: qty 177

## 2012-02-11 SURGICAL SUPPLY — 81 items
ADH SKN CLS APL DERMABOND .7 (GAUZE/BANDAGES/DRESSINGS) ×1
APL SKNCLS STERI-STRIP NONHPOA (GAUZE/BANDAGES/DRESSINGS)
BAG DECANTER FOR FLEXI CONT (MISCELLANEOUS) ×2 IMPLANT
BANDAGE GAUZE ELAST BULKY 4 IN (GAUZE/BANDAGES/DRESSINGS) ×2 IMPLANT
BENZOIN TINCTURE PRP APPL 2/3 (GAUZE/BANDAGES/DRESSINGS) IMPLANT
BIT DRILL 2.3 12 FIXED (INSTRUMENTS) IMPLANT
BIT DRILL NEURO 2X3.1 SFT TUCH (MISCELLANEOUS) ×1 IMPLANT
BLADE ULTRA TIP 2M (BLADE) ×1 IMPLANT
BLOCK PROFUSE SZ 6 (Bone Implant) ×1 IMPLANT
BUR BARREL STRAIGHT FLUTE 4.0 (BURR) ×2 IMPLANT
CAGE CERVICAL 7 (Cage) ×2 IMPLANT
CAGE CERVICAL 8 (Cage) ×2 IMPLANT
CANISTER SUCTION 2500CC (MISCELLANEOUS) ×2 IMPLANT
CLOTH BEACON ORANGE TIMEOUT ST (SAFETY) ×2 IMPLANT
CONT SPEC 4OZ CLIKSEAL STRL BL (MISCELLANEOUS) ×3 IMPLANT
COVER MAYO STAND STRL (DRAPES) ×2 IMPLANT
DERMABOND ADVANCED (GAUZE/BANDAGES/DRESSINGS) ×1
DERMABOND ADVANCED .7 DNX12 (GAUZE/BANDAGES/DRESSINGS) ×1 IMPLANT
DRAPE LAPAROTOMY 100X72 PEDS (DRAPES) ×2 IMPLANT
DRAPE MICROSCOPE LEICA (MISCELLANEOUS) IMPLANT
DRAPE POUCH INSTRU U-SHP 10X18 (DRAPES) ×2 IMPLANT
DRAPE PROXIMA HALF (DRAPES) ×1 IMPLANT
DRESSING TELFA 8X3 (GAUZE/BANDAGES/DRESSINGS) IMPLANT
DRILL 12MM (INSTRUMENTS) ×2
DRILL NEURO 2X3.1 SOFT TOUCH (MISCELLANEOUS) ×2
DURAPREP 6ML APPLICATOR 50/CS (WOUND CARE) ×2 IMPLANT
ELECT COATED BLADE 2.86 ST (ELECTRODE) ×3 IMPLANT
ELECT REM PT RETURN 9FT ADLT (ELECTROSURGICAL) ×2
ELECTRODE REM PT RTRN 9FT ADLT (ELECTROSURGICAL) ×1 IMPLANT
GAUZE SPONGE 4X4 16PLY XRAY LF (GAUZE/BANDAGES/DRESSINGS) IMPLANT
GLOVE BIO SURGEON STRL SZ8 (GLOVE) ×3 IMPLANT
GLOVE BIOGEL PI IND STRL 7.0 (GLOVE) IMPLANT
GLOVE BIOGEL PI IND STRL 7.5 (GLOVE) IMPLANT
GLOVE BIOGEL PI IND STRL 8 (GLOVE) ×1 IMPLANT
GLOVE BIOGEL PI IND STRL 8.5 (GLOVE) ×1 IMPLANT
GLOVE BIOGEL PI INDICATOR 7.0 (GLOVE) ×1
GLOVE BIOGEL PI INDICATOR 7.5 (GLOVE) ×1
GLOVE BIOGEL PI INDICATOR 8 (GLOVE) ×1
GLOVE BIOGEL PI INDICATOR 8.5 (GLOVE) ×1
GLOVE ECLIPSE 7.5 STRL STRAW (GLOVE) ×1 IMPLANT
GLOVE ECLIPSE 8.0 STRL XLNG CF (GLOVE) ×3 IMPLANT
GLOVE EXAM NITRILE LRG STRL (GLOVE) IMPLANT
GLOVE EXAM NITRILE MD LF STRL (GLOVE) IMPLANT
GLOVE EXAM NITRILE XL STR (GLOVE) IMPLANT
GLOVE EXAM NITRILE XS STR PU (GLOVE) IMPLANT
GLOVE SURG SS PI 7.0 STRL IVOR (GLOVE) ×2 IMPLANT
GOWN BRE IMP SLV AUR LG STRL (GOWN DISPOSABLE) IMPLANT
GOWN BRE IMP SLV AUR XL STRL (GOWN DISPOSABLE) ×5 IMPLANT
GOWN STRL REIN 2XL LVL4 (GOWN DISPOSABLE) ×2 IMPLANT
HEAD HALTER (SOFTGOODS) ×2 IMPLANT
KIT BASIN OR (CUSTOM PROCEDURE TRAY) ×2 IMPLANT
KIT ROOM TURNOVER OR (KITS) ×2 IMPLANT
NDL HYPO 18GX1.5 BLUNT FILL (NEEDLE) ×1 IMPLANT
NDL HYPO 25X1 1.5 SAFETY (NEEDLE) ×1 IMPLANT
NDL SPNL 22GX3.5 QUINCKE BK (NEEDLE) ×1 IMPLANT
NEEDLE HYPO 18GX1.5 BLUNT FILL (NEEDLE) ×2 IMPLANT
NEEDLE HYPO 25X1 1.5 SAFETY (NEEDLE) ×2 IMPLANT
NEEDLE SPNL 22GX3.5 QUINCKE BK (NEEDLE) ×6 IMPLANT
NS IRRIG 1000ML POUR BTL (IV SOLUTION) ×2 IMPLANT
PACK LAMINECTOMY NEURO (CUSTOM PROCEDURE TRAY) ×2 IMPLANT
PAD ARMBOARD 7.5X6 YLW CONV (MISCELLANEOUS) ×2 IMPLANT
PIN DISTRACTION 14MM (PIN) ×5 IMPLANT
PLATE 32MM (Plate) ×1 IMPLANT
RUBBERBAND STERILE (MISCELLANEOUS) IMPLANT
SCREW 12MM (Screw) ×8 IMPLANT
SPACER SPNL 7D LRG 16X14X7XNS (Cage) IMPLANT
SPACER SPNL 7D LRG 16X14X8XNS (Cage) IMPLANT
SPCR SPNL 7D LRG 16X14X7XNS (Cage) ×1 IMPLANT
SPCR SPNL 7D LRG 16X14X8XNS (Cage) ×1 IMPLANT
SPONGE GAUZE 4X4 12PLY (GAUZE/BANDAGES/DRESSINGS) IMPLANT
SPONGE INTESTINAL PEANUT (DISPOSABLE) ×3 IMPLANT
SPONGE SURGIFOAM ABS GEL SZ50 (HEMOSTASIS) IMPLANT
STAPLER SKIN PROX WIDE 3.9 (STAPLE) IMPLANT
STRIP CLOSURE SKIN 1/2X4 (GAUZE/BANDAGES/DRESSINGS) IMPLANT
SUT VIC AB 3-0 SH 8-18 (SUTURE) ×2 IMPLANT
SYR 20ML ECCENTRIC (SYRINGE) ×2 IMPLANT
SYR 3ML LL SCALE MARK (SYRINGE) ×2 IMPLANT
TOWEL OR 17X24 6PK STRL BLUE (TOWEL DISPOSABLE) ×2 IMPLANT
TOWEL OR 17X26 10 PK STRL BLUE (TOWEL DISPOSABLE) ×2 IMPLANT
TRAP SPECIMEN MUCOUS 40CC (MISCELLANEOUS) ×1 IMPLANT
WATER STERILE IRR 1000ML POUR (IV SOLUTION) ×2 IMPLANT

## 2012-02-11 NOTE — Op Note (Signed)
02/11/2012  1:43 PM  PATIENT:  Courtney Cole  41 y.o. female  PRE-OPERATIVE DIAGNOSIS:  cervical herniated disc cervical spondylosis cervical degenerative disc disease cervical radiculopathy C 5/6, C 6/7  POST-OPERATIVE DIAGNOSIS:  cervical herniated disc cervical spondylosis cervical degenerative disc disease cervical radiculopathy C 5/6, C 6/7  PROCEDURE:  Procedure(s) (LRB): ANTERIOR CERVICAL DECOMPRESSION/DISCECTOMY FUSION 2 LEVELS with PEEK cages, auto and allograft, plate (N/A)  SURGEON:  Surgeon(s) and Role:    * Maeola Harman, MD - Primary  PHYSICIAN ASSISTANT: Marikay Alar, MD  ASSISTANTS: Poteat, RN   ANESTHESIA:   general  EBL:  Total I/O In: 1600 [I.V.:1600] Out: 50 [Blood:50]  BLOOD ADMINISTERED:none  DRAINS: none   LOCAL MEDICATIONS USED:  LIDOCAINE   SPECIMEN:  No Specimen  DISPOSITION OF SPECIMEN:  N/A  COUNTS:  YES  TOURNIQUET:  * No tourniquets in log *  DICTATION: DICTATION: Patient is 42 year old female with left arm pain and weakness with HNP, spondylosis, radiculopathy C5/6 and C6/7  PROCEDURE: Patient was brought to operating room and following the smooth and uncomplicated induction of general endotracheal anesthesia her head was placed on a horseshoe head holder she was placed in 5 pounds of Holter traction and her anterior neck was prepped and draped in usual sterile fashion. An incision was made on the left side of midline after infiltrating the skin and subcutaneous tissues with local lidocaine. The platysmal layer was incised and subplatysmal dissection was performed exposing the anterior border sternocleidomastoid muscle. Using blunt dissection the carotid sheath was kept lateral and trachea and esophagus kept medial exposing the anterior cervical spine. A bent spinal needle was placed it was felt to be the C 4/5 level and this was confirmed on intraoperative x-ray. Longus coli muscles were taken down from the anterior cervical spine using  electrocautery and key elevator and self-retaining retractor was placed exposing the C5/6 and C6/7 levels. The interspaces were incised and a thorough discectomy was performed. Distraction pins were placed. Initially the C6/7 level was operated. Uncinate spurs and central spondylitic ridges were drilled down with a high-speed drill. The spinal cord dura and both C7 nerve roots were widely decompressed. Hemostasis was assured. After trial sizing a 7 mm peek interbody cage was selected and packed with profuse block and autograft. This was tamped into position and countersunk appropriately. Attention was the paid to the C5/6 level, where similar decompression was performed.  Uncinate spurs and central spondylitic ridges were drilled down with a high-speed drill. The spinal cord dura and both C6 nerve roots were widely decompressed. Hemostasis was assured. After trial sizing a 8 mm peek interbody cage was selected and packed with profuse block and autograft. This was tamped into position and countersunk appropriately.Distraction weight was removed. A 32 mm trestle luxe anterior cervical plate was affixed to the cervical spine with 12 mm variable-angle screws 2 at C5, 2 at C6 and 2 at C7. All screws were well-positioned and locking mechanisms were engaged. A final X ray was obtained which showed well positioned grafts and anterior plate without complicating features. Soft tissues were inspected and found to be in good repair. The wound was irrigated. The platysma layer was closed with 3-0 Vicryl stitches and the skin was reapproximated with 3-0 Vicryl subcuticular stitches. The wound was dressed with Dermabond. Counts were correct at the end of the case. Patient was extubated and taken to recovery in stable and satisfactory condition.    PLAN OF CARE: Overnight observation  PATIENT DISPOSITION:  PACU - hemodynamically stable.   Delay start of Pharmacological VTE agent (>24hrs) due to surgical blood loss or risk  of bleeding: yes

## 2012-02-11 NOTE — Anesthesia Postprocedure Evaluation (Signed)
  Anesthesia Post-op Note  Patient: Courtney Cole  Procedure(s) Performed: Procedure(s) (LRB): ANTERIOR CERVICAL DECOMPRESSION/DISCECTOMY FUSION 2 LEVELS (N/A)  Patient Location: PACU  Anesthesia Type: General  Level of Consciousness: awake, oriented, sedated and patient cooperative  Airway and Oxygen Therapy: Patient Spontanous Breathing and Patient connected to nasal cannula oxygen  Post-op Pain: mild  Post-op Assessment: Post-op Vital signs reviewed, Patient's Cardiovascular Status Stable, Respiratory Function Stable, Patent Airway, No signs of Nausea or vomiting and Pain level controlled  Post-op Vital Signs: stable  Complications: No apparent anesthesia complications

## 2012-02-11 NOTE — Transfer of Care (Signed)
Immediate Anesthesia Transfer of Care Note  Patient: Courtney Cole  Procedure(s) Performed: Procedure(s) (LRB): ANTERIOR CERVICAL DECOMPRESSION/DISCECTOMY FUSION 2 LEVELS (N/A)  Patient Location: PACU  Anesthesia Type: General  Level of Consciousness: awake, alert , oriented and sedated  Airway & Oxygen Therapy: Patient Spontanous Breathing and Patient connected to nasal cannula oxygen  Post-op Assessment: Report given to PACU RN, Post -op Vital signs reviewed and stable and Patient moving all extremities  Post vital signs: Reviewed and stable  Complications: No apparent anesthesia complications

## 2012-02-11 NOTE — Anesthesia Preprocedure Evaluation (Addendum)
Anesthesia Evaluation  Patient identified by MRN, date of birth, ID band Patient awake    Reviewed: Allergy & Precautions, H&P , NPO status , Patient's Chart, lab work & pertinent test results  Airway Mallampati: I TM Distance: >3 FB Neck ROM: full    Dental  (+) Teeth Intact   Pulmonary          Cardiovascular Exercise Tolerance: Good Rhythm:regular Rate:Normal     Neuro/Psych  Headaches,    GI/Hepatic   Endo/Other    Renal/GU      Musculoskeletal   Abdominal   Peds  Hematology   Anesthesia Other Findings   Reproductive/Obstetrics                           Anesthesia Physical Anesthesia Plan  ASA: I  Anesthesia Plan: General   Post-op Pain Management:    Induction: Intravenous  Airway Management Planned: Oral ETT  Additional Equipment:   Intra-op Plan:   Post-operative Plan: Extubation in OR  Informed Consent: I have reviewed the patients History and Physical, chart, labs and discussed the procedure including the risks, benefits and alternatives for the proposed anesthesia with the patient or authorized representative who has indicated his/her understanding and acceptance.     Plan Discussed with: CRNA, Anesthesiologist and Surgeon  Anesthesia Plan Comments:         Anesthesia Quick Evaluation

## 2012-02-11 NOTE — Progress Notes (Signed)
Awake, alert, conversant.  MAEW with full power both biceps, tricewps, hand intrinsics.  Doing well.

## 2012-02-11 NOTE — Preoperative (Signed)
Beta Blockers   Reason not to administer Beta Blockers:Not Applicable 

## 2012-02-11 NOTE — H&P (Signed)
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 Eden Emms Baxley for left shoulder, arm and hand pain.  She describes that the pain is excruciating and has been going on since 12/05/2011.  It does into her left shoulder, left arm, and left hand.  She has been taking Vicodin 5/500 daily.  She says she is not able to get any relief.  She complains of a severe pain.    REVIEW OF SYSTEMS:   A detailed Review of Systems sheet was reviewed with the patient.  Pertinent positives include glasses/contacts, nosebleeds, arm weakness, arm pain, neck pain, anxiety and depression.  All other systems are negative; this includes Constitutional symptoms, Cardiovascular, Endocrine, Respiratory, Gastrointestinal, Genitourinary, Integumentary & Breast, Neurologic, Hematologic/Lymphatic, and Allergic/Immunologic.    PAST MEDICAL HISTORY:    . Current Medical Conditions:    She has a history of anxiety and depression, and ADD. As previously described above.    . Prior Operations and Hospitalizations:   Significant for tubal ligation in 2004.  . Medications and Allergies:  Aplenzin 522 q.d., generic Xanax 1 mg. p.r.n., Generic Valtrex 500 mg. q.d., Viibryd 20 mg. q.d., Vyvanse 70 mg. q.d., generic Vicodin 5/500 p.r.n. pain.  No known drug allergies.  Marland Kitchen Height and Weight:     5'4" tall, 170 pounds.    FAMILY HISTORY:    Mother is in good health at 52.  Father is in good health at 9.  SOCIAL HISTORY:    She denies tobacco or drug use.  She is a social drinker of alcoholic beverages.        DIAGNOSTIC STUDIES:   Courtney Cole had an MRI of her cervical spine after she was felt to have a cervical radiculopathy on examination and this demonstrates that at C6-7 she had a left foraminal disc protrusion causing moderate left foraminal stenosis.  There  is also a left lateral recess protrusion at C5-6 with left foraminal stenosis with borderline stenosis at C3-4.  Cervical radiographs were obtained which demonstrate mild spondylosis without malalignment of the cervical spine.     PHYSICAL EXAMINATION:    . General Appearance:   On examination today, Courtney Cole is a pleasant and cooperative woman in no acute distress.     . Blood Pressure, Pulse, Respirations: Her blood pressure is 144/94, heart rate 74 and regular, respirations 18.    Marland Kitchen HEENT - normocephalic, atraumatic.  The pupils are equal, round and reactive to light.  The extraocular muscles are intact.  Sclerae - white.  Conjunctiva - pink.  Oropharynx benign.  Uvula midline.   . Neck - she has a positive Spurlings' maneuver to the left with left parascapular discomfort to palpation.    Marland Kitchen Respiratory - there is normal respiratory effort with good intercostal function.  Lungs are clear to auscultation.  There are no rales, rhonchi or wheezes.    . Cardiovascular - the heart has regular rate and rhythm to auscultation.  No murmurs are appreciated.  There is no extremity edema, cyanosis or clubbing.  There are palpable pedal pulses.    . Abdomen - soft, nontender, no hepatosplenomegaly appreciated or masses.  There are active bowel sounds.  No guarding or rebound.    . Musculoskeletal Examination - the patient is able to walk about the examining room with a normal, casual, heel  and toe gait with negative shoulder impingement testing bilaterally.    NEUROLOGICAL EXAMINATION: The patient is oriented to time, person and place and has good recall of both recent and remote memory with normal attention span and concentration.  The patient speaks with clear and fluent speech and exhibits normal language function and appropriate fund of knowledge.  . Cranial Nerve Examination - pupils are equal, round and reactive to light.  Extraocular movements are full.  Visual fields are full to  confrontational testing.  Facial sensation and facial movement are symmetric and intact.  Hearing is intact to finger rub.  Palate is upgoing.  Shoulder shrug is symmetric.  Tongue protrudes in the midline.    . Motor Examination - motor strength is 5/5 in the bilateral deltoids, biceps, triceps, handgrips, wrist extensors, interosseous with exception of left biceps 4/5, left triceps 4-/5 and left wrist flexion 4/5.  All other motor groups are intact with full strength.   . Sensory Examination - normal to light touch and pinprick sensation in the upper and lower extremities.   . Deep Tendon Reflexes - 2 in the biceps, 2 in right triceps and absent in left triceps, and 2 at the brachioradialis, 2 in the knees, 2 in the ankles.  The great toes are downgoing to plantar stimulation.    . Cerebellar Examination - normal coordination in upper and lower extremities and normal rapid alternating movements.  Romberg test is negative.    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  She has cervical disc herniation at C6-7 on the left and foraminal stenosis at C5-6 with spondylosis at this level as well.  I recommended based on the severity of her pain and weakness that she undergo surgery which would consist of an anterior cervical decompression and fusion at C5-6 and C6-7 levels.  She wishes to proceed with this.  We called her in a prescription for Hydrocodone 5/325, 1 q.4-6.h. p.r.n. for pain dispensed #60.  We will plan on going ahead with surgery on 02/09/2012.  She is fitted for a soft cervical collar.  We went over models and the exact nature of the surgical procedure and explained it to her in detail.  She is aware of the risks and benefits and wishes to proceed.    [facility]    Danae Orleans. Venetia Maxon, M.D.

## 2012-02-11 NOTE — Interval H&P Note (Signed)
History and Physical Interval Note:  02/11/2012 7:36 AM  Courtney Cole  has presented today for surgery, with the diagnosis of cervical herniated disc cervical spondylosis cervical degenerative disc disease cervical radiculopathy  The various methods of treatment have been discussed with the patient and family. After consideration of risks, benefits and other options for treatment, the patient has consented to  Procedure(s) (LRB): ANTERIOR CERVICAL DECOMPRESSION/DISCECTOMY FUSION 2 LEVELS (N/A) as a surgical intervention .  The patients' history has been reviewed, patient examined, no change in status, stable for surgery.  I have reviewed the patients' chart and labs.  Questions were answered to the patient's satisfaction.     [name]  Date of Initial H&P: 01/20/2012  History reviewed, patient examined, no change in status, stable for surgery.

## 2012-02-11 NOTE — OR Nursing (Signed)
Added Screws under implants at 1648 by Reece Packer, RN.

## 2012-02-12 NOTE — Discharge Instructions (Signed)

## 2012-02-12 NOTE — Discharge Summary (Signed)
Physician Discharge Summary  Patient ID: Courtney Cole MRN: [id number] DOB/AGE: 1970-12-14 41 y.o.  Admit date: 02/11/2012 Discharge date: 02/12/2012  Admission Diagnoses: cervical herniated disc cervical spondylosis cervical degenerative disc disease cervical radiculopathy C 5/6, C 6/7   Discharge Diagnoses: cervical herniated disc cervical spondylosis cervical degenerative disc disease cervical radiculopathy C 5/6, C 6/7 s/p ANTERIOR CERVICAL DECOMPRESSION/DISCECTOMY FUSION 2 LEVELS with PEEK cages, auto and allograft, plate   Active Problems:  * No active hospital problems. *    Discharged Condition: good  Hospital Course: Courtney Cole was admitted for ACDF C5-6, C6-7 on 02/11/12 with Dx of cervical herniated disc cervical spondylosis cervical degenerative disc disease cervical radiculopathy C 5/6, C 6/7. Following uncomplicated surgery, she recovered in Neuro PACU before transfer to 3500 for overnight observation.   Consults: None  Significant Diagnostic Studies: radiology: X-Ray: intra-operative  Treatments: surgery: ANTERIOR CERVICAL DECOMPRESSION/DISCECTOMY FUSION 2 LEVELS with PEEK cages, auto and allograft, plate    Discharge Exam: Blood pressure 97/63, pulse 73, temperature 98.2 F (36.8 C), temperature source Oral, resp. rate 16, SpO2 96.00%. Alert, conversant. Reports mild soreness throat & posterior neck - reassured. Incision without erythema, swelling, or drainage. Dermabond intact. Good strength BUE.   Disposition: D/C to home. Pt understands d/c instructions & will call office for 3wk f/u with Dr. Venetia Maxon. Rx's given: Percocet 5/325 1 po q4-6 hrs prn pain #50. Robaxin 500mg  1po q8hrs prn spasm #60.  Medication List  As of 02/12/2012  8:01 AM   TAKE these medications         ALPRAZolam 1 MG tablet   Commonly known as: XANAX   Take 1 mg by mouth at bedtime as needed. For sleep      APLENZIN 522 MG Tb24   Generic drug: BuPROPion HBr   Take 522 mg by mouth  daily.      HYDROcodone-acetaminophen 5-325 MG per tablet   Commonly known as: NORCO   Take 1 tablet by mouth every 6 (six) hours as needed. For pain      lisdexamfetamine 70 MG capsule   Commonly known as: VYVANSE   Take 70 mg by mouth every morning.      valACYclovir 1000 MG tablet   Commonly known as: VALTREX   Take 500 mg by mouth daily.      VIIBRYD 20 MG Tabs   Generic drug: Vilazodone HCl   Take 20 mg by mouth daily.             Signed: Georgiann Cocker 02/12/2012, 8:01 AM

## 2012-02-12 NOTE — Care Management Note (Signed)
    Page 1 of 1   02/12/2012     4:29:44 PM   CARE MANAGEMENT NOTE 02/12/2012  Patient:  Courtney Cole, Courtney Cole   Account Number:  [id number]  Date Initiated:  02/12/2012  Documentation initiated by:  Anette Guarneri  Subjective/Objective Assessment:   POD#1 s/p 2 level ACDF  independent w/ambulation, ADL  no HH services ordered, no DME ordered     Action/Plan:   d/c home self care   Anticipated DC Date:  02/12/2012   Anticipated DC Plan:           Choice offered to / List presented to:             Status of service:  Completed, signed off Medicare Important Message given?  NO (If response is "NO", the following Medicare IM given date fields will be blank) Date Medicare IM given:   Date Additional Medicare IM given:    Discharge Disposition:    Per UR Regulation:  Reviewed for med. necessity/level of care/duration of stay  If discussed at Long Length of Stay Meetings, dates discussed:    Comments:

## 2012-02-15 ENCOUNTER — Encounter (HOSPITAL_COMMUNITY): Payer: Self-pay | Admitting: Neurosurgery

## 2012-06-27 ENCOUNTER — Other Ambulatory Visit: Payer: Self-pay | Admitting: Internal Medicine

## 2012-06-27 DIAGNOSIS — Z1231 Encounter for screening mammogram for malignant neoplasm of breast: Secondary | ICD-10-CM

## 2012-07-04 ENCOUNTER — Ambulatory Visit
Admission: RE | Admit: 2012-07-04 | Discharge: 2012-07-04 | Disposition: A | Discharge status: Home / Self Care | Payer: BC Managed Care – PPO | Source: Ambulatory Visit | Attending: Internal Medicine | Admitting: Internal Medicine

## 2012-07-04 DIAGNOSIS — Z1231 Encounter for screening mammogram for malignant neoplasm of breast: Secondary | ICD-10-CM

## 2012-09-12 ENCOUNTER — Other Ambulatory Visit: Payer: Self-pay | Admitting: Internal Medicine

## 2013-01-14 IMAGING — CR DG CERVICAL SPINE 2 OR 3 VIEWS
1 series · 1 of 1 positions shown · non-contrast
Comparison: Cervical spine x-rays 01/20/2012 [facility].
Cervical spine MRI 01/14/2012 [HOSPITAL].

CLINICAL DATA: C5-6 and C6-7 ACDF.

OPERATIVE CERVICAL SPINE - 2-3 VIEW 02/11/2012:

[view not recorded]
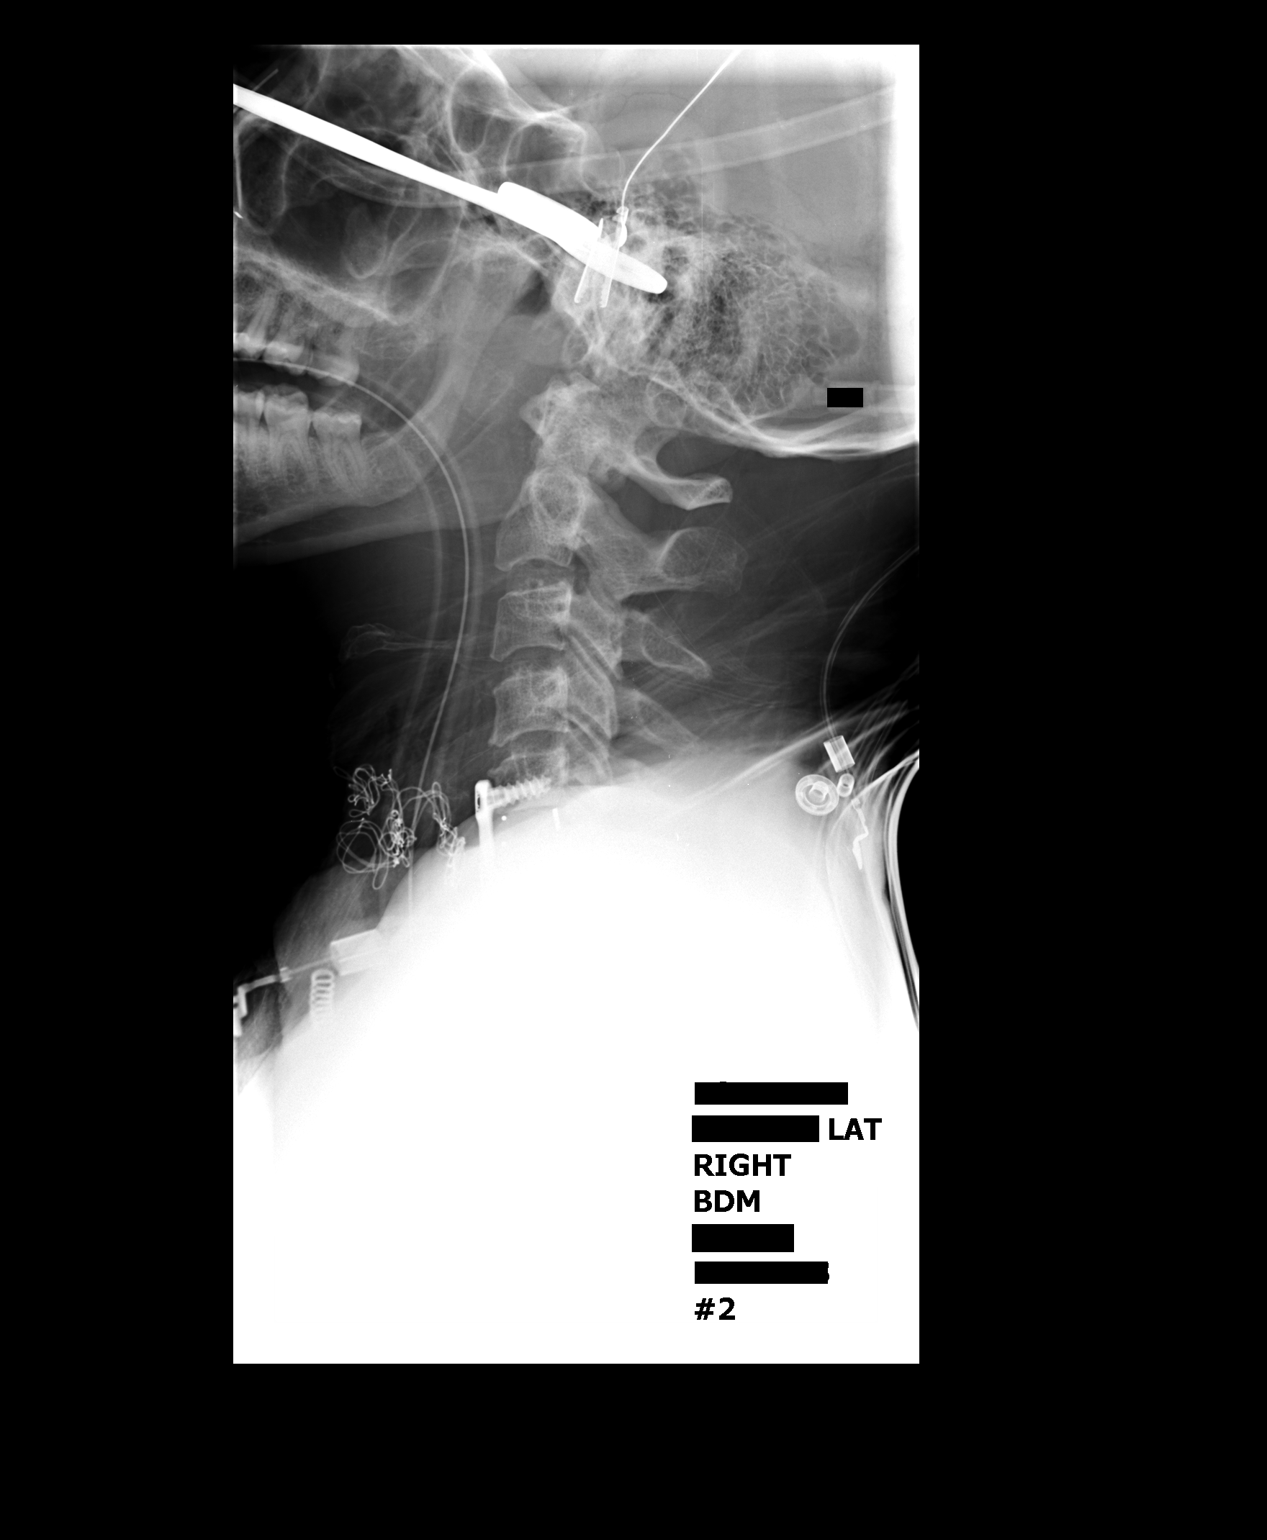

[1 of 1 positions shown; findings below may reference images not displayed]

FINDINGS: Images were submitted for interpretation post-
operatively.  Initial image at 6100 hours with visualization
through C5 demonstrates a localizer needle in the C4-5 disc space.
Second image at 4845 hours with visualization through C6
demonstrates ACDF with hardware at C5-6.  The C7 level was not
visualized.  Alignment anatomic through upper C6.  C5-6 bone fusion
plugs appropriately positioned in the disc space.
IMPRESSION: ACDF C5-6 and C6-7, though C7 was not visualized on the post-
operative image.

## 2013-05-01 ENCOUNTER — Encounter: Payer: Self-pay | Admitting: Internal Medicine

## 2013-06-27 ENCOUNTER — Encounter: Payer: Self-pay | Admitting: Internal Medicine

## 2013-06-27 DIAGNOSIS — Z0000 Encounter for general adult medical examination without abnormal findings: Secondary | ICD-10-CM

## 2013-09-19 ENCOUNTER — Other Ambulatory Visit: Payer: Self-pay | Admitting: Internal Medicine

## 2014-04-03 ENCOUNTER — Ambulatory Visit (INDEPENDENT_AMBULATORY_CARE_PROVIDER_SITE_OTHER): Payer: BC Managed Care – PPO | Admitting: Internal Medicine

## 2014-04-03 ENCOUNTER — Other Ambulatory Visit (HOSPITAL_COMMUNITY)
Admission: RE | Admit: 2014-04-03 | Discharge: 2014-04-03 | Disposition: A | Discharge status: Home / Self Care | Payer: BC Managed Care – PPO | Source: Ambulatory Visit | Attending: Internal Medicine | Admitting: Internal Medicine

## 2014-04-03 ENCOUNTER — Encounter: Payer: Self-pay | Admitting: Internal Medicine

## 2014-04-03 VITALS — BP 122/80 | HR 88 | Temp 98.3°F | Ht 64.5 in | Wt 174.5 lb

## 2014-04-03 DIAGNOSIS — F1021 Alcohol dependence, in remission: Secondary | ICD-10-CM

## 2014-04-03 DIAGNOSIS — Z1239 Encounter for other screening for malignant neoplasm of breast: Secondary | ICD-10-CM

## 2014-04-03 DIAGNOSIS — Z0000 Encounter for general adult medical examination without abnormal findings: Secondary | ICD-10-CM

## 2014-04-03 DIAGNOSIS — Z13220 Encounter for screening for lipoid disorders: Secondary | ICD-10-CM

## 2014-04-03 DIAGNOSIS — Z01419 Encounter for gynecological examination (general) (routine) without abnormal findings: Secondary | ICD-10-CM | POA: Insufficient documentation

## 2014-04-03 DIAGNOSIS — Z130 Encounter for screening for diseases of the blood and blood-forming organs and certain disorders involving the immune mechanism: Secondary | ICD-10-CM

## 2014-04-03 LAB — COMPREHENSIVE METABOLIC PANEL
ALBUMIN: 4.8 g/dL (ref 3.5–5.2)
ALK PHOS: 77 U/L (ref 39–117)
ALT: 20 U/L (ref 0–35)
AST: 21 U/L (ref 0–37)
BUN: 15 mg/dL (ref 6–23)
CO2: 27 mEq/L (ref 19–32)
Calcium: 10.1 mg/dL (ref 8.4–10.5)
Chloride: 102 mEq/L (ref 96–112)
Creat: 0.97 mg/dL (ref 0.50–1.10)
GLUCOSE: 89 mg/dL (ref 70–99)
POTASSIUM: 4.5 meq/L (ref 3.5–5.3)
SODIUM: 136 meq/L (ref 135–145)
TOTAL PROTEIN: 7.3 g/dL (ref 6.0–8.3)
Total Bilirubin: 0.6 mg/dL (ref 0.2–1.2)

## 2014-04-03 LAB — CBC WITH DIFFERENTIAL/PLATELET
BASOS PCT: 1 % (ref 0–1)
Basophils Absolute: 0 10*3/uL (ref 0.0–0.1)
Eosinophils Absolute: 0 10*3/uL (ref 0.0–0.7)
Eosinophils Relative: 1 % (ref 0–5)
HCT: 39.6 % (ref 36.0–46.0)
HEMOGLOBIN: 13.6 g/dL (ref 12.0–15.0)
LYMPHS ABS: 1.3 10*3/uL (ref 0.7–4.0)
LYMPHS PCT: 31 % (ref 12–46)
MCH: 32.2 pg (ref 26.0–34.0)
MCHC: 34.3 g/dL (ref 30.0–36.0)
MCV: 93.6 fL (ref 78.0–100.0)
MONOS PCT: 8 % (ref 3–12)
Monocytes Absolute: 0.3 10*3/uL (ref 0.1–1.0)
NEUTROS ABS: 2.5 10*3/uL (ref 1.7–7.7)
NEUTROS PCT: 59 % (ref 43–77)
Platelets: 301 10*3/uL (ref 150–400)
RBC: 4.23 MIL/uL (ref 3.87–5.11)
RDW: 13.9 % (ref 11.5–15.5)
WBC: 4.2 10*3/uL (ref 4.0–10.5)

## 2014-04-03 LAB — LIPID PANEL
Cholesterol: 245 mg/dL — ABNORMAL HIGH (ref 0–200)
HDL: 84 mg/dL (ref >39)
LDL CALC: 146 mg/dL — AB (ref 0–99)
TRIGLYCERIDES: 76 mg/dL (ref <150)
Total CHOL/HDL Ratio: 2.9 Ratio
VLDL: 15 mg/dL (ref 0–40)

## 2014-04-03 LAB — POCT URINALYSIS DIPSTICK
BILIRUBIN UA: NEGATIVE
GLUCOSE UA: NEGATIVE
Ketones, UA: NEGATIVE
LEUKOCYTES UA: NEGATIVE
NITRITE UA: NEGATIVE
Protein, UA: NEGATIVE
Spec Grav, UA: 1.01
Urobilinogen, UA: NEGATIVE
pH, UA: 6.5

## 2014-04-03 LAB — TSH: TSH: 1.691 u[IU]/mL (ref 0.350–4.500)

## 2014-04-03 NOTE — Patient Instructions (Signed)
Continue medications per Dr. Evelene CroonKaur return in one year. Labs are pending.

## 2014-04-03 NOTE — Progress Notes (Signed)
Subjective:    Patient ID: Courtney RuizHeather Bove, female    DOB: [birth year], 43 y.o.   MRN: [id number]  HPI  43 year old female in today for health maintenance and evaluation of medical issues. She has a history of allergic rhinitis, migraine headaches, herpes simplex type I. History of abnormal Pap smear in 1996 status post LEEP procedure. Psychiatrist is Dr. Evelene CroonKaur. She has a history of depression and takes SSRI and Wellbutrin. Allergy tested by Dr. Beaulah DinningBardelas in August 2008. Was allergic to mold. Also was thought to have an element of GE reflux and was placed on PPI. Also was placed on albuterol, Astelin nasal spray and Nasonex spray. Spirometry was normal in 2008 and no changes were noted using a bronchodilator. In 2006 she saw Dr. Neale BurlyFreeman for headaches and was placed on Topamax but currently not taking that.  Hospitalized with alcohol and drug overdose December 2000. Upon discharge was referred to outpatient psychiatric program. Has attended Alcoholics Anonymous.  No history of serious illnesses or accidents.  Social history : Does not smoke. Has a Masters degree. Recovering alcoholic. First marriage ended in divorce. No children. Has been dating someone now for 3 years. 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  Family history: Mother with history of hypertension, hyperlipidemia, diabetes mellitus, obesity, urticaria, depression and breast cancer. Father with history of coronary artery disease and MI. No siblings.     Review of Systems  Constitutional: Negative.   HENT:       History of HSV type I  Eyes: Negative.   Respiratory: Negative.   Cardiovascular: Negative.   Gastrointestinal: Negative.   Genitourinary:       History of abnormal Pap smear status post LEEP procedure  Allergic/Immunologic: Positive for environmental allergies.       Objective:   Physical Exam  Vitals reviewed. Constitutional: She appears well-developed and well-nourished. No distress.  HENT:  Head:  Normocephalic and atraumatic.  Right Ear: External ear normal.  Left Ear: External ear normal.  Mouth/Throat: Oropharynx is clear and moist. No oropharyngeal exudate.  Eyes: Conjunctivae and EOM are normal. Pupils are equal, round, and reactive to light. Right eye exhibits no discharge. Left eye exhibits no discharge. No scleral icterus.  Neck: Neck supple. No JVD present. No thyromegaly present.  Cardiovascular: Normal rate, regular rhythm, normal heart sounds and intact distal pulses.   No murmur heard. Pulmonary/Chest: Effort normal and breath sounds normal. No respiratory distress. She has no wheezes. She has no rales. She exhibits no tenderness.  Breasts normal female without masses  Abdominal: Soft. Bowel sounds are normal. She exhibits no distension and no mass. There is no tenderness. There is no rebound and no guarding.  Genitourinary: No vaginal discharge found.  Pap taken. Uterus is retroverted.  Musculoskeletal: Normal range of motion. She exhibits no edema.  Lymphadenopathy:    She has no cervical adenopathy.  Neurological: She is alert. She has normal reflexes. She displays normal reflexes. No cranial nerve deficit. Coordination normal.  Skin: Skin is warm and dry. No rash noted. She is not diaphoretic.  Psychiatric: She has a normal mood and affect. Her behavior is normal. Judgment and thought content normal.          Assessment & Plan:  Normal health maintenance exam  History of depression  Recovering alcoholic  History of migraine headaches  History of abnormal Pap smear 1996 status post LEEP procedure with no abnormal Pap smears since then  Allergic rhinitis  History of herpes simplex type  I  Plan: Continue medications for anxiety and depression per Dr. Evelene CroonKaur and return in one year or as needed. Has mammogram. Order placed

## 2014-04-04 LAB — VITAMIN D 25 HYDROXY (VIT D DEFICIENCY, FRACTURES): Vit D, 25-Hydroxy: 69 ng/mL (ref 30–89)

## 2014-04-05 LAB — CYTOLOGY - PAP

## 2014-04-05 NOTE — Progress Notes (Signed)
Patient informed. Will return on 04/17/2014 at 2:45pm.

## 2014-04-17 ENCOUNTER — Encounter: Payer: Self-pay | Admitting: Internal Medicine

## 2014-04-17 ENCOUNTER — Ambulatory Visit (INDEPENDENT_AMBULATORY_CARE_PROVIDER_SITE_OTHER): Payer: BC Managed Care – PPO | Admitting: Internal Medicine

## 2014-04-17 ENCOUNTER — Other Ambulatory Visit (HOSPITAL_COMMUNITY)
Admission: RE | Admit: 2014-04-17 | Discharge: 2014-04-17 | Disposition: A | Discharge status: Home / Self Care | Payer: BC Managed Care – PPO | Source: Ambulatory Visit | Attending: Internal Medicine | Admitting: Internal Medicine

## 2014-04-17 VITALS — BP 128/80 | HR 84 | Wt 174.0 lb

## 2014-04-17 DIAGNOSIS — Z124 Encounter for screening for malignant neoplasm of cervix: Secondary | ICD-10-CM

## 2014-04-17 DIAGNOSIS — Z01419 Encounter for gynecological examination (general) (routine) without abnormal findings: Secondary | ICD-10-CM | POA: Insufficient documentation

## 2014-04-17 DIAGNOSIS — Z7184 Encounter for health counseling related to travel: Secondary | ICD-10-CM

## 2014-04-17 DIAGNOSIS — Z7189 Other specified counseling: Secondary | ICD-10-CM

## 2014-04-17 DIAGNOSIS — R87615 Unsatisfactory cytologic smear of cervix: Secondary | ICD-10-CM

## 2014-04-17 DIAGNOSIS — E785 Hyperlipidemia, unspecified: Secondary | ICD-10-CM

## 2014-04-17 MED ORDER — ATORVASTATIN CALCIUM 10 MG PO TABS: 10.0000 mg | ORAL_TABLET | Freq: Every day | ORAL | Status: DC

## 2014-04-17 MED ORDER — SCOPOLAMINE 1 MG/3DAYS TD PT72: 1.0000 | MEDICATED_PATCH | TRANSDERMAL | Status: DC

## 2014-04-17 NOTE — Addendum Note (Signed)
Addended by: Judy PimpleEILAND, Tennille Montelongo M on: 04/17/2014 03:49 PM   Modules accepted: Orders

## 2014-04-17 NOTE — Patient Instructions (Addendum)
Start Lipitor 10 mg daily and return in 3 months for OV lipid liver panels. Scopolamine patches prescribed for trip to [place].

## 2014-04-17 NOTE — Progress Notes (Signed)
   Subjective:    Patient ID: Courtney Cole, female    DOB: [birth year], 43 y.o.   MRN: [id number]  HPI In today to repeat Pap smear at my request. She has never had children. Has a tight cervical os. Endocervical cells were not present. Saline was applied to the Cytobrush today. Also here discuss hyperlipidemia. Both parents have hyperlipidemia. Admits to not following a low fat diet. 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 Thinks she may want to start lipid-lowering medication. Also going on cruise to [place] with her family and wants  scopolamine patches prescribed.    Review of Systems     Objective:   Physical Exam  Repeat Pap smear obtained. Discussed recent fasting lipid panel. Total cholesterol is 245 and previously 2 years ago to 25. LDL cholesterol has increased from 1:30 12/03/1944.      Assessment & Plan:  Hyperlipidemia  Repeat Pap smear for insufficient cells  Travel medication  Plan: See above regarding scopolamine patches to apply every 3 days. Repeat Pap smear obtained using Cytobrush with saline. Start Lipitor 10 mg daily and return in 3 months for office visit lipid panel liver functions.

## 2014-04-20 LAB — CYTOLOGY - PAP

## 2014-05-01 NOTE — Progress Notes (Signed)
Patient out of town. Results letter mailed to her.

## 2014-07-19 ENCOUNTER — Encounter: Payer: Self-pay | Admitting: Internal Medicine

## 2014-07-19 ENCOUNTER — Ambulatory Visit (INDEPENDENT_AMBULATORY_CARE_PROVIDER_SITE_OTHER): Payer: BC Managed Care – PPO | Admitting: Internal Medicine

## 2014-07-19 VITALS — BP 108/72 | HR 84 | Temp 97.5°F | Resp 14 | Ht 64.5 in | Wt 177.0 lb

## 2014-07-19 DIAGNOSIS — E785 Hyperlipidemia, unspecified: Secondary | ICD-10-CM

## 2014-07-19 LAB — LIPID PANEL
Cholesterol: 162 mg/dL (ref 0–200)
HDL: 80 mg/dL (ref >39)
LDL Cholesterol: 73 mg/dL (ref 0–99)
Total CHOL/HDL Ratio: 2 Ratio
Triglycerides: 47 mg/dL (ref <150)
VLDL: 9 mg/dL (ref 0–40)

## 2014-07-19 LAB — HEPATIC FUNCTION PANEL
ALK PHOS: 73 U/L (ref 39–117)
ALT: 16 U/L (ref 0–35)
AST: 19 U/L (ref 0–37)
Albumin: 4.6 g/dL (ref 3.5–5.2)
BILIRUBIN DIRECT: 0.1 mg/dL (ref 0.0–0.3)
BILIRUBIN INDIRECT: 0.4 mg/dL (ref 0.2–1.2)
BILIRUBIN TOTAL: 0.5 mg/dL (ref 0.2–1.2)
Total Protein: 7.2 g/dL (ref 6.0–8.3)

## 2014-07-19 NOTE — Progress Notes (Signed)
   Subjective:    Patient ID: Courtney RuizHeather Cole, female    DOB: Jan 16, 1971, [age] y.o.   MRN: [id number]  HPI For 3 month  recheck on hyperlipidemia. Tolerating lipid lowering med fine without complaints. Will  get flu vaccine at work.    Review of Systems     Objective:   Physical Exam Not examibned. Spent 15 min speaking with pt. No other concerns other than how to use My Chart which was explained. Lipid panel drawn.       Assessment & Plan:  Hyperlipidemia  Plan:Review lipid panel and liver functions and make further recommendations. May be able to be seen once yearly.

## 2014-07-19 NOTE — Patient Instructions (Signed)
Continue same meds

## 2014-07-20 ENCOUNTER — Telehealth: Payer: Self-pay

## 2014-07-20 NOTE — Telephone Encounter (Signed)
Left message advising patient of normal labs and continue same medications.

## 2014-07-20 NOTE — Telephone Encounter (Signed)
Message copied by Judd GaudierLEVENS, Laurell Coalson M on Fri Jul 20, 2014 11:38 AM ------      Message from: Margaree MackintoshBAXLEY, MARY J      Created: Fri Jul 20, 2014 10:42 AM       Normal result. Continue same meds and recheck at next CPE ------

## 2014-08-16 ENCOUNTER — Other Ambulatory Visit: Payer: Self-pay | Admitting: Internal Medicine

## 2014-09-22 ENCOUNTER — Other Ambulatory Visit: Payer: Self-pay | Admitting: Internal Medicine

## 2015-03-26 ENCOUNTER — Encounter: Payer: Self-pay | Admitting: Internal Medicine

## 2015-03-26 ENCOUNTER — Ambulatory Visit (INDEPENDENT_AMBULATORY_CARE_PROVIDER_SITE_OTHER): Payer: BC Managed Care – PPO | Admitting: Internal Medicine

## 2015-03-26 VITALS — BP 110/78 | HR 100 | Temp 98.2°F | Wt 170.0 lb

## 2015-03-26 DIAGNOSIS — F411 Generalized anxiety disorder: Secondary | ICD-10-CM | POA: Diagnosis not present

## 2015-03-26 DIAGNOSIS — F329 Major depressive disorder, single episode, unspecified: Secondary | ICD-10-CM

## 2015-03-26 DIAGNOSIS — R5383 Other fatigue: Secondary | ICD-10-CM

## 2015-03-26 DIAGNOSIS — F32A Depression, unspecified: Secondary | ICD-10-CM

## 2015-03-26 LAB — CBC WITH DIFFERENTIAL/PLATELET
BASOS PCT: 0 % (ref 0–1)
Basophils Absolute: 0 10*3/uL (ref 0.0–0.1)
EOS ABS: 0 10*3/uL (ref 0.0–0.7)
Eosinophils Relative: 0 % (ref 0–5)
HCT: 40 % (ref 36.0–46.0)
HEMOGLOBIN: 13.8 g/dL (ref 12.0–15.0)
LYMPHS ABS: 1.7 10*3/uL (ref 0.7–4.0)
Lymphocytes Relative: 38 % (ref 12–46)
MCH: 32.5 pg (ref 26.0–34.0)
MCHC: 34.5 g/dL (ref 30.0–36.0)
MCV: 94.1 fL (ref 78.0–100.0)
MPV: 10 fL (ref 8.6–12.4)
Monocytes Absolute: 0.4 10*3/uL (ref 0.1–1.0)
Monocytes Relative: 8 % (ref 3–12)
NEUTROS PCT: 54 % (ref 43–77)
Neutro Abs: 2.4 10*3/uL (ref 1.7–7.7)
Platelets: 294 10*3/uL (ref 150–400)
RBC: 4.25 MIL/uL (ref 3.87–5.11)
RDW: 13.2 % (ref 11.5–15.5)
WBC: 4.5 10*3/uL (ref 4.0–10.5)

## 2015-03-27 ENCOUNTER — Telehealth: Payer: Self-pay | Admitting: *Deleted

## 2015-03-27 LAB — FOLLICLE STIMULATING HORMONE: FSH: 65.8 m[IU]/mL

## 2015-03-27 LAB — T4, FREE: FREE T4: 1.02 ng/dL (ref 0.80–1.80)

## 2015-03-27 LAB — VITAMIN B12: VITAMIN B 12: 708 pg/mL (ref 211–911)

## 2015-03-27 LAB — TSH: TSH: 1.08 u[IU]/mL (ref 0.350–4.500)

## 2015-03-27 NOTE — Telephone Encounter (Signed)
Left message for patient to call back  

## 2015-03-27 NOTE — Telephone Encounter (Signed)
Reviewed lab results with patient.

## 2015-03-31 NOTE — Progress Notes (Signed)
   Subjective:    Patient ID: Courtney Cole, female    DOB: Sep 07, 1971, 44 y.o.   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 Has not been able to go to work. Brokering engagement recently. Felt that fiance was not invested in relationship. They were living together.  She sees Dr. Lafayette Dragon for medication and counseling. However this is more of an acute issue. She has appointment with Dr. Lafayette Dragon soon. Really felt she could not go to work for a week or so. She [place] the [place] and spent time with her parents.  Mother thinks it mood swings are coming from hormones. Patient is not had a period in several months.  Patient is on bupropion, Viibryd, Xanax for sleep 1 mg at bedtime, Adderall.  Has noticed some easy bruisability recently particularly right posterior thigh  Review of Systems     Objective:   Physical Exam  Not examined. Spent 25 minutes speaking with patient today about these issues and concerns.      Assessment & Plan:  Anxiety  Depression  Situational stress with recent broken engagement  Fatigue  Plan: Check TSH and FSH. Check B-12 and CBC with differential. Continue medication prescribed by Dr. Lafayette Dragon in follow-up with her soon.

## 2015-03-31 NOTE — Patient Instructions (Signed)
Follow-up with Dr. Lafayette Dragon. Lab studies drawn and pending.

## 2015-08-13 ENCOUNTER — Telehealth: Payer: Self-pay | Admitting: Internal Medicine

## 2015-08-13 NOTE — Telephone Encounter (Signed)
Atorvastatin 10mg  denied.  Patient hasn't been seen for med check or lab draw or CPE since 07/2014.  Per Dr. Lenord FellersBaxley, patient must been seen for med check with Lipid/Liver lab work prior to medication being filled.  Left message on machine for patient.

## 2015-10-17 ENCOUNTER — Other Ambulatory Visit: Payer: Self-pay | Admitting: Internal Medicine

## 2015-10-25 ENCOUNTER — Other Ambulatory Visit: Payer: BC Managed Care – PPO | Admitting: Internal Medicine

## 2015-10-25 DIAGNOSIS — Z0000 Encounter for general adult medical examination without abnormal findings: Secondary | ICD-10-CM

## 2015-10-25 DIAGNOSIS — Z1321 Encounter for screening for nutritional disorder: Secondary | ICD-10-CM

## 2015-10-25 DIAGNOSIS — Z79899 Other long term (current) drug therapy: Secondary | ICD-10-CM

## 2015-10-25 DIAGNOSIS — Z130 Encounter for screening for diseases of the blood and blood-forming organs and certain disorders involving the immune mechanism: Secondary | ICD-10-CM

## 2015-10-25 DIAGNOSIS — Z1329 Encounter for screening for other suspected endocrine disorder: Secondary | ICD-10-CM

## 2015-10-25 DIAGNOSIS — E785 Hyperlipidemia, unspecified: Secondary | ICD-10-CM

## 2015-10-25 LAB — LIPID PANEL
CHOLESTEROL: 219 mg/dL — AB (ref 125–200)
HDL: 90 mg/dL (ref >=46)
LDL Cholesterol: 117 mg/dL (ref <130)
TRIGLYCERIDES: 62 mg/dL (ref <150)
Total CHOL/HDL Ratio: 2.4 Ratio (ref <=5.0)
VLDL: 12 mg/dL (ref <30)

## 2015-10-25 LAB — CBC WITH DIFFERENTIAL/PLATELET
BASOS PCT: 1 % (ref 0–1)
Basophils Absolute: 0 10*3/uL (ref 0.0–0.1)
EOS PCT: 1 % (ref 0–5)
Eosinophils Absolute: 0 10*3/uL (ref 0.0–0.7)
HCT: 37.6 % (ref 36.0–46.0)
HEMOGLOBIN: 12.7 g/dL (ref 12.0–15.0)
Lymphocytes Relative: 31 % (ref 12–46)
Lymphs Abs: 1.1 10*3/uL (ref 0.7–4.0)
MCH: 32.2 pg (ref 26.0–34.0)
MCHC: 33.8 g/dL (ref 30.0–36.0)
MCV: 95.2 fL (ref 78.0–100.0)
MONO ABS: 0.3 10*3/uL (ref 0.1–1.0)
MONOS PCT: 7 % (ref 3–12)
MPV: 9.7 fL (ref 8.6–12.4)
NEUTROS ABS: 2.2 10*3/uL (ref 1.7–7.7)
Neutrophils Relative %: 60 % (ref 43–77)
Platelets: 286 10*3/uL (ref 150–400)
RBC: 3.95 MIL/uL (ref 3.87–5.11)
RDW: 12.9 % (ref 11.5–15.5)
WBC: 3.6 10*3/uL — ABNORMAL LOW (ref 4.0–10.5)

## 2015-10-25 LAB — COMPLETE METABOLIC PANEL WITH GFR
ALBUMIN: 4.6 g/dL (ref 3.6–5.1)
ALK PHOS: 66 U/L (ref 33–115)
ALT: 23 U/L (ref 6–29)
AST: 24 U/L (ref 10–35)
BILIRUBIN TOTAL: 0.6 mg/dL (ref 0.2–1.2)
BUN: 10 mg/dL (ref 7–25)
CALCIUM: 9.5 mg/dL (ref 8.6–10.2)
CO2: 25 mmol/L (ref 20–31)
Chloride: 102 mmol/L (ref 98–110)
Creat: 0.73 mg/dL (ref 0.50–1.10)
GFR, Est African American: >89 mL/min (ref >=60)
GLUCOSE: 90 mg/dL (ref 65–99)
POTASSIUM: 4.2 mmol/L (ref 3.5–5.3)
SODIUM: 134 mmol/L — AB (ref 135–146)
Total Protein: 7.5 g/dL (ref 6.1–8.1)

## 2015-10-25 LAB — TSH: TSH: 1.29 u[IU]/mL (ref 0.350–4.500)

## 2015-10-26 LAB — VITAMIN D 25 HYDROXY (VIT D DEFICIENCY, FRACTURES): Vit D, 25-Hydroxy: 40 ng/mL (ref 30–100)

## 2015-10-28 ENCOUNTER — Ambulatory Visit (INDEPENDENT_AMBULATORY_CARE_PROVIDER_SITE_OTHER): Payer: BC Managed Care – PPO | Admitting: Internal Medicine

## 2015-10-28 ENCOUNTER — Encounter: Payer: Self-pay | Admitting: Internal Medicine

## 2015-10-28 VITALS — BP 118/82 | HR 90 | Temp 97.7°F | Resp 20 | Ht 63.25 in | Wt 144.0 lb

## 2015-10-28 DIAGNOSIS — F329 Major depressive disorder, single episode, unspecified: Secondary | ICD-10-CM

## 2015-10-28 DIAGNOSIS — B009 Herpesviral infection, unspecified: Secondary | ICD-10-CM

## 2015-10-28 DIAGNOSIS — Z0000 Encounter for general adult medical examination without abnormal findings: Secondary | ICD-10-CM

## 2015-10-28 DIAGNOSIS — J309 Allergic rhinitis, unspecified: Secondary | ICD-10-CM | POA: Diagnosis not present

## 2015-10-28 DIAGNOSIS — F1021 Alcohol dependence, in remission: Secondary | ICD-10-CM

## 2015-10-28 DIAGNOSIS — F418 Other specified anxiety disorders: Secondary | ICD-10-CM

## 2015-10-28 DIAGNOSIS — E785 Hyperlipidemia, unspecified: Secondary | ICD-10-CM | POA: Diagnosis not present

## 2015-10-28 DIAGNOSIS — F988 Other specified behavioral and emotional disorders with onset usually occurring in childhood and adolescence: Secondary | ICD-10-CM

## 2015-10-28 DIAGNOSIS — F909 Attention-deficit hyperactivity disorder, unspecified type: Secondary | ICD-10-CM | POA: Diagnosis not present

## 2015-10-28 DIAGNOSIS — Z8669 Personal history of other diseases of the nervous system and sense organs: Secondary | ICD-10-CM

## 2015-10-28 DIAGNOSIS — F32A Depression, unspecified: Secondary | ICD-10-CM

## 2015-10-28 DIAGNOSIS — F419 Anxiety disorder, unspecified: Secondary | ICD-10-CM

## 2015-10-28 LAB — POCT URINALYSIS DIPSTICK
Bilirubin, UA: NEGATIVE
Glucose, UA: NEGATIVE
Ketones, UA: NEGATIVE
LEUKOCYTES UA: NEGATIVE
Nitrite, UA: NEGATIVE
PH UA: 8
PROTEIN UA: NEGATIVE
RBC UA: NEGATIVE
Spec Grav, UA: >=1.03
UROBILINOGEN UA: 0.2

## 2015-10-28 MED ORDER — ATORVASTATIN CALCIUM 10 MG PO TABS: 10.0000 mg | ORAL_TABLET | Freq: Every day | ORAL | Status: DC

## 2015-10-28 MED ORDER — VALACYCLOVIR HCL 1 G PO TABS: 500.0000 mg | ORAL_TABLET | Freq: Every day | ORAL | Status: DC

## 2015-10-28 NOTE — Patient Instructions (Signed)
Refill Lipitor and valtrex RTC in 6 months for lipid panel and liver functions. It was a pleasure tom see you today

## 2015-10-28 NOTE — Progress Notes (Signed)
Subjective:    Patient ID: Courtney Cole, female    DOB: [birth year], 45 y.o.   MRN: [id number]  HPI 45 year old White Female in today for health maintenance exam and evaluation of medical issues. In June she broke up with fianc. He was seeing someone else without her knowledge. This was upsetting but she's done well. Seems to enjoy her private time. 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 She cannot retire for 4 more years. Job takes a Energy manager. Sometimes she just sleeps all daily Saturday because she's minimally so tired. She ran out of Lipitor about 2 months ago and has not taken it since. Previously total cholesterol was 162 and it is now 219. Previously LDL cholesterol was 62 and is now 117. She agrees to restart Lipitor and return in 6 months for lipid panel and liver functions without office visit. She has history of herpes simplex type I and gets fever blisters fairly frequently. Valtrex refilled today.   She has a history of allergic rhinitis, migraine headaches, herpes simplex type I. History of abnormal Pap smear 1996 status post LEEP procedure. History of depression treated with SSRI and Wellbutrin. On Adderall for attention deficit disorder. Allergy tested by Dr. Virgel Gess is August 2008. Was allergic to mold. Also was thought to have element of GE reflux and was placed on PPI. Also was prescribed albuterol, Astelin nasal spray, Nasonex spray. Spirometry was normal. No changes noted after using bronchodilator. In 2006 she saw Dr. Neale Burly for headaches and was placed on Topamax but currently is not taking that.  Hospitalized with alcohol and drug overdose December 2000. Upon discharge was referred to outpatient psychiatric program. Has attended AA.  No history of other serious illnesses or accidents.  Social history: Does not smoke. Has Masters degree. Recovering alcoholic. First marriage ended in divorce. No children. Employed as a school principal.  Family history: Mother  with history of hypertension, hyperlipidemia, diabetes mellitus, obesity, breast cancer, urticaria, depression. Father with history of coronary artery disease and MI. No siblings.    Review of Systems no longer having menstrual periods. Not sexually active.     Objective:   Physical Exam  Constitutional: She is oriented to person, place, and time. She appears well-developed and well-nourished. No distress.  HENT:  Head: Normocephalic and atraumatic.  Right Ear: External ear normal.  Left Ear: External ear normal.  Mouth/Throat: Oropharynx is clear and moist. No oropharyngeal exudate.  Eyes: Conjunctivae and EOM are normal. Pupils are equal, round, and reactive to light. Right eye exhibits no discharge. Left eye exhibits no discharge. No scleral icterus.  Neck: Neck supple. No JVD present. No thyromegaly present.  Cardiovascular: Normal rate, regular rhythm, normal heart sounds and intact distal pulses.   No murmur heard. Pulmonary/Chest: Effort normal and breath sounds normal. No respiratory distress. She has no wheezes. She has no rales.  Abdominal: Soft. Bowel sounds are normal. She exhibits no distension and no mass. There is no tenderness. There is no rebound and no guarding.  Genitourinary:  Pap done in 2015. Bimanual normal.  Musculoskeletal: Normal range of motion. She exhibits no edema.  Lymphadenopathy:    She has no cervical adenopathy.  Neurological: She is alert and oriented to person, place, and time. She has normal reflexes. No cranial nerve deficit. Coordination normal.  Skin: Skin is warm and dry. No rash noted. She is not diaphoretic.  Psychiatric: She has a normal mood and affect. Her behavior is normal. Judgment and thought  content normal.  Vitals reviewed.         Assessment & Plan:  History of alcoholism-in remission for many years  Hyperlipidemia-restart Lipitor 10 mg daily and follow-up in 6 months with lipid panel liver functions without office  visit  Herpes simplex type I-refill Valtrex to take on when necessary basis  History of migraine headaches  Allergic rhinitis  Anxiety depression-treated with SSRI and Wellbutrin  History of attention deficit disorder-treated by Dr. Evelene Croon with Adderall  Plan: Restart Lipitor and return in 6 months. Refill Valtrex.

## 2015-11-02 ENCOUNTER — Encounter: Payer: Self-pay | Admitting: Internal Medicine

## 2015-11-02 DIAGNOSIS — Z8669 Personal history of other diseases of the nervous system and sense organs: Secondary | ICD-10-CM | POA: Insufficient documentation

## 2015-11-02 DIAGNOSIS — F32A Depression, unspecified: Secondary | ICD-10-CM | POA: Insufficient documentation

## 2015-11-02 DIAGNOSIS — F419 Anxiety disorder, unspecified: Secondary | ICD-10-CM | POA: Insufficient documentation

## 2015-11-02 DIAGNOSIS — F329 Major depressive disorder, single episode, unspecified: Secondary | ICD-10-CM | POA: Insufficient documentation

## 2015-11-02 DIAGNOSIS — B009 Herpesviral infection, unspecified: Secondary | ICD-10-CM | POA: Insufficient documentation

## 2015-11-02 DIAGNOSIS — F988 Other specified behavioral and emotional disorders with onset usually occurring in childhood and adolescence: Secondary | ICD-10-CM | POA: Insufficient documentation

## 2016-04-24 ENCOUNTER — Other Ambulatory Visit: Payer: BC Managed Care – PPO | Admitting: Internal Medicine

## 2016-11-06 ENCOUNTER — Other Ambulatory Visit: Payer: Self-pay | Admitting: Internal Medicine

## 2016-11-06 NOTE — Telephone Encounter (Signed)
Not seen since January 2017. Needs CPE appt. Please call.

## 2016-11-12 ENCOUNTER — Other Ambulatory Visit: Payer: BC Managed Care – PPO | Admitting: Internal Medicine

## 2016-11-18 NOTE — Telephone Encounter (Signed)
Coming in on Friday 2/23 for HA and will address CPE scheduling at that time.

## 2016-11-27 ENCOUNTER — Encounter: Payer: Self-pay | Admitting: Internal Medicine

## 2016-11-27 ENCOUNTER — Ambulatory Visit (INDEPENDENT_AMBULATORY_CARE_PROVIDER_SITE_OTHER): Payer: BC Managed Care – PPO | Admitting: Internal Medicine

## 2016-11-27 VITALS — BP 112/80 | HR 83 | Temp 98.0°F | Ht 63.0 in | Wt 152.0 lb

## 2016-11-27 DIAGNOSIS — J0100 Acute maxillary sinusitis, unspecified: Secondary | ICD-10-CM | POA: Diagnosis not present

## 2016-11-27 DIAGNOSIS — F411 Generalized anxiety disorder: Secondary | ICD-10-CM | POA: Diagnosis not present

## 2016-11-27 DIAGNOSIS — G43011 Migraine without aura, intractable, with status migrainosus: Secondary | ICD-10-CM | POA: Diagnosis not present

## 2016-11-27 DIAGNOSIS — F439 Reaction to severe stress, unspecified: Secondary | ICD-10-CM | POA: Diagnosis not present

## 2016-11-27 DIAGNOSIS — Z79899 Other long term (current) drug therapy: Secondary | ICD-10-CM | POA: Diagnosis not present

## 2016-11-27 DIAGNOSIS — F329 Major depressive disorder, single episode, unspecified: Secondary | ICD-10-CM

## 2016-11-27 DIAGNOSIS — E785 Hyperlipidemia, unspecified: Secondary | ICD-10-CM | POA: Diagnosis not present

## 2016-11-27 DIAGNOSIS — F32A Depression, unspecified: Secondary | ICD-10-CM

## 2016-11-27 LAB — HEPATIC FUNCTION PANEL
ALBUMIN: 4.4 g/dL (ref 3.6–5.1)
ALK PHOS: 50 U/L (ref 33–115)
ALT: 18 U/L (ref 6–29)
AST: 22 U/L (ref 10–35)
Bilirubin, Direct: 0.1 mg/dL (ref <=0.2)
Indirect Bilirubin: 0.3 mg/dL (ref 0.2–1.2)
TOTAL PROTEIN: 7.2 g/dL (ref 6.1–8.1)
Total Bilirubin: 0.4 mg/dL (ref 0.2–1.2)

## 2016-11-27 LAB — LIPID PANEL
Cholesterol: 172 mg/dL (ref <200)
HDL: 87 mg/dL (ref >50)
LDL CALC: 72 mg/dL (ref <100)
Total CHOL/HDL Ratio: 2 Ratio (ref <5.0)
Triglycerides: 65 mg/dL (ref <150)
VLDL: 13 mg/dL (ref <30)

## 2016-11-27 MED ORDER — ATORVASTATIN CALCIUM 10 MG PO TABS: 10.0000 mg | ORAL_TABLET | Freq: Every day | ORAL | 0 refills | Status: DC

## 2016-11-27 MED ORDER — AZITHROMYCIN 250 MG PO TABS: ORAL_TABLET | ORAL | 0 refills | Status: DC

## 2016-11-27 MED ORDER — FLUCONAZOLE 150 MG PO TABS: 150.0000 mg | ORAL_TABLET | Freq: Once | ORAL | 1 refills | Status: AC

## 2016-11-27 MED ORDER — PREDNISONE 10 MG PO TABS: ORAL_TABLET | ORAL | 0 refills | Status: DC

## 2016-11-27 MED ORDER — BUTALBITAL-APAP-CAFFEINE 50-325-40 MG PO TABS: 1.0000 | ORAL_TABLET | Freq: Three times a day (TID) | ORAL | 0 refills | Status: AC

## 2016-11-27 NOTE — Progress Notes (Signed)
   Subjective:    Patient ID: Courtney Cole, female    DOB: Mar 29, 1971, 46 y.o.   MRN: [id number]  HPI  Longstanding history of migraine headaches that have returned over past 8 weeks. Mostly right sided not relieved with Tylenol.She took a leave of absence from work for the past 4 months and has to go back to work next week. Her father has been ill for a number of months after having gastric surgery. He has to be fed by IV as he apparently has swallowing issues. He is to see Neurologist in the near future. He was in a nursing home and now has been brought back to his home. His wife has some help at home and [name]'s been helping out as well. Herbert SetaHeather looks tired.  She is on Wellbutrin, Xanax 1 mg at bedtime, Adderall 20 mg daily per her psychiatrist. Long-standing history of depression and attention deficit issues.  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  She has a history of hyperlipidemia and fasting lipid panel liver functions drawn today. Needs refill on Lipitor.    Review of Systems see above     Objective:   Physical Exam Funduscopic exam is benign. Deep tendon reflexes 2+ and symmetrical. No gross focal deficits on brief neurological exam. Cranial nerves II through XII grossly intact. TMs are clear. Pharynx is clear. Neck is supple. Chest clear. Skin warm and dry. Alert and oriented 3.       Assessment & Plan:  Probable acute sinusitis  Migraine headache  Situational stress  Hyperlipidemia-labs pending  Plan: Sterapred DS 10 mg 6 day dosepak. Zithromax Z-PAK take as directed. Diflucan 150 mg tablet should Candida vaginitis symptoms develop while on antibiotics. Fasting labs pending. Lipitor refilled for 90 days. Fioricet sparingly 1 by mouth every 8 hours when necessary for headache.

## 2016-11-27 NOTE — Patient Instructions (Signed)
Zithromax Z-PAK take as directed for sinusitis. Fioricet to take sparingly to relieve headache pain. Sterapred DS 10 mg 6 day dosepak. Continue other medications previously prescribed. Refill Lipitor. Fasting lipid panel drawn today.

## 2016-12-01 ENCOUNTER — Other Ambulatory Visit: Payer: Self-pay

## 2016-12-01 MED ORDER — VALACYCLOVIR HCL 1 G PO TABS: 500.0000 mg | ORAL_TABLET | Freq: Every day | ORAL | 11 refills | Status: DC

## 2017-05-03 ENCOUNTER — Other Ambulatory Visit: Payer: Self-pay | Admitting: Internal Medicine

## 2017-12-20 ENCOUNTER — Other Ambulatory Visit: Payer: Self-pay | Admitting: Internal Medicine

## 2018-01-17 ENCOUNTER — Other Ambulatory Visit: Payer: Self-pay | Admitting: Internal Medicine

## 2018-02-22 ENCOUNTER — Other Ambulatory Visit: Payer: Self-pay | Admitting: Internal Medicine

## 2018-03-29 ENCOUNTER — Other Ambulatory Visit: Payer: Self-pay | Admitting: Internal Medicine

## 2018-03-29 NOTE — Telephone Encounter (Signed)
Last CPE 2017. Please call her before refilling.

## 2018-04-18 ENCOUNTER — Telehealth: Payer: Self-pay | Admitting: Emergency Medicine

## 2018-04-18 MED ORDER — VALACYCLOVIR HCL 1 G PO TABS: 500.0000 mg | ORAL_TABLET | Freq: Every day | ORAL | 0 refills | Status: DC

## 2018-04-18 NOTE — Telephone Encounter (Signed)
OK Valtrex 1000 mg #30  withdirections:one half tab daily with NO refill

## 2018-04-18 NOTE — Telephone Encounter (Signed)
Escribed

## 2018-04-18 NOTE — Telephone Encounter (Signed)
Pt called and wants to know if she can get a refill on her prescription valACYclovir (VALTREX) 1000 MG tablet. She scheduled her CPE for end of August. Pharmacy is [facility]- [address]. Thanks.

## 2018-05-23 ENCOUNTER — Other Ambulatory Visit: Payer: Self-pay | Admitting: Internal Medicine

## 2018-05-23 DIAGNOSIS — F329 Major depressive disorder, single episode, unspecified: Secondary | ICD-10-CM

## 2018-05-23 DIAGNOSIS — E785 Hyperlipidemia, unspecified: Secondary | ICD-10-CM

## 2018-05-23 DIAGNOSIS — F1021 Alcohol dependence, in remission: Secondary | ICD-10-CM

## 2018-05-23 DIAGNOSIS — F32A Depression, unspecified: Secondary | ICD-10-CM

## 2018-05-23 DIAGNOSIS — B009 Herpesviral infection, unspecified: Secondary | ICD-10-CM

## 2018-05-23 DIAGNOSIS — Z0000 Encounter for general adult medical examination without abnormal findings: Secondary | ICD-10-CM

## 2018-05-23 DIAGNOSIS — F419 Anxiety disorder, unspecified: Secondary | ICD-10-CM

## 2018-05-23 DIAGNOSIS — Z8669 Personal history of other diseases of the nervous system and sense organs: Secondary | ICD-10-CM

## 2018-05-23 DIAGNOSIS — F988 Other specified behavioral and emotional disorders with onset usually occurring in childhood and adolescence: Secondary | ICD-10-CM

## 2018-05-26 ENCOUNTER — Other Ambulatory Visit: Payer: Self-pay | Admitting: Internal Medicine

## 2018-05-26 ENCOUNTER — Encounter: Payer: Self-pay | Admitting: Internal Medicine

## 2018-05-26 ENCOUNTER — Other Ambulatory Visit (HOSPITAL_COMMUNITY)
Admission: RE | Admit: 2018-05-26 | Discharge: 2018-05-26 | Disposition: A | Discharge status: Home / Self Care | Payer: 59 | Source: Ambulatory Visit | Attending: Internal Medicine | Admitting: Internal Medicine

## 2018-05-26 ENCOUNTER — Ambulatory Visit (INDEPENDENT_AMBULATORY_CARE_PROVIDER_SITE_OTHER): Payer: 59 | Admitting: Internal Medicine

## 2018-05-26 ENCOUNTER — Other Ambulatory Visit: Payer: 59 | Admitting: Internal Medicine

## 2018-05-26 VITALS — BP 110/72 | HR 91 | Ht 63.5 in | Wt 159.0 lb

## 2018-05-26 DIAGNOSIS — Z8669 Personal history of other diseases of the nervous system and sense organs: Secondary | ICD-10-CM

## 2018-05-26 DIAGNOSIS — F1021 Alcohol dependence, in remission: Secondary | ICD-10-CM

## 2018-05-26 DIAGNOSIS — E7800 Pure hypercholesterolemia, unspecified: Secondary | ICD-10-CM

## 2018-05-26 DIAGNOSIS — B009 Herpesviral infection, unspecified: Secondary | ICD-10-CM

## 2018-05-26 DIAGNOSIS — F988 Other specified behavioral and emotional disorders with onset usually occurring in childhood and adolescence: Secondary | ICD-10-CM

## 2018-05-26 DIAGNOSIS — Z0000 Encounter for general adult medical examination without abnormal findings: Secondary | ICD-10-CM

## 2018-05-26 DIAGNOSIS — F439 Reaction to severe stress, unspecified: Secondary | ICD-10-CM | POA: Diagnosis not present

## 2018-05-26 DIAGNOSIS — Z124 Encounter for screening for malignant neoplasm of cervix: Secondary | ICD-10-CM

## 2018-05-26 DIAGNOSIS — F419 Anxiety disorder, unspecified: Secondary | ICD-10-CM

## 2018-05-26 DIAGNOSIS — F329 Major depressive disorder, single episode, unspecified: Secondary | ICD-10-CM

## 2018-05-26 DIAGNOSIS — Z1231 Encounter for screening mammogram for malignant neoplasm of breast: Secondary | ICD-10-CM

## 2018-05-26 DIAGNOSIS — F32A Depression, unspecified: Secondary | ICD-10-CM

## 2018-05-26 DIAGNOSIS — E785 Hyperlipidemia, unspecified: Secondary | ICD-10-CM

## 2018-05-26 LAB — POCT URINALYSIS DIPSTICK
Appearance: NORMAL
BILIRUBIN UA: NEGATIVE
Glucose, UA: NEGATIVE
KETONES UA: NEGATIVE
Leukocytes, UA: NEGATIVE
Nitrite, UA: NEGATIVE
Odor: NORMAL
Protein, UA: POSITIVE — AB
RBC UA: NEGATIVE
SPEC GRAV UA: 1.01 (ref 1.010–1.025)
Urobilinogen, UA: 0.2 E.U./dL
pH, UA: 8 (ref 5.0–8.0)

## 2018-05-26 MED ORDER — ATORVASTATIN CALCIUM 10 MG PO TABS: 10.0000 mg | ORAL_TABLET | Freq: Every day | ORAL | 1 refills | Status: DC

## 2018-05-26 NOTE — Progress Notes (Signed)
Subjective:    Patient ID: Courtney Cole, female    DOB: [birth year], 47 y.o.   MRN: [id number]  HPI 47 year old Female in today for health maintenance exam and evaluation of medical issues.  She continues to go to home in [place].  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  She had to give up her job in order to take care of her parents and live here in [place].  She is back in for several times a week to [place].  Mother recently started dialysis.  Father has memory issues dating back to a prolonged illness requiring several surgeries.  Patient has a history of hyperlipidemia but has not been taking lipid-lowering medication.  She needs to get back on that.  She has a history of HSV type I and takes Valtrex.  History of allergic rhinitis, migraine headaches.  Abnormal Pap smear in 1996 status post LEEP procedure.  History of depression treated with SSRI and Wellbutrin.  On Adderall for attention deficit disorder.  Sees Dr. Evelene Croon for these medications.  Has been allergy tested by Dr. Beaulah Dinning in 2008.  Was allergic to mold and was thought to have an element of GE reflux and was placed on PPI.  Also was prescribed albuterol, Astelin nasal spray, Nasonex spray.  Spirometry was normal.  No changes noted after using bronchodilator.  In 2006 she saw Dr. Neale Burly for migraine headaches and was placed on Topamax but no longer takes it.  Her graph hospitalized with alcohol and drug overdose December 2000.  Upon discharge was referred to outpatient psychiatric program.  Has attended AA.  No history of other serious illnesses or accidents.  Social history: She does not smoke.  Has a masters degree.  She is an alcoholic in recovery.  First marriage ended in divorce.  No children.  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  Family history: Mother with history of  hypertension, hyperlipidemia, diabetes mellitus, obesity, breast cancer, urticaria, depression, renal failure.  Father with history of coronary artery disease and MI.  He has memory issues.  No siblings.    Review of Systems hot flashes, menopausal at 44     Objective:   Physical Exam  Constitutional: She is oriented to person, place, and time. She appears well-developed and well-nourished. No distress.  HENT:  Head: Normocephalic and atraumatic.  Right Ear: External ear normal.  Left Ear: External ear normal.  Nose: Nose normal.  Mouth/Throat: Oropharynx is clear and moist.  Eyes: Pupils are equal, round, and reactive to light. EOM are normal. Right eye exhibits no discharge. Left eye exhibits no discharge. No scleral icterus.  Neck: No JVD present. No thyromegaly present.  Cardiovascular: Normal rate, regular rhythm, normal heart sounds and intact distal pulses. Exam reveals no friction rub.  No murmur heard. Pulmonary/Chest: Effort normal and breath sounds normal. No stridor. No respiratory distress. She has no wheezes. She has no rales.  Breasts normal female without masses  Abdominal: Soft. She exhibits no distension and no mass. There is no tenderness. There is no guarding.  Genitourinary:  Genitourinary Comments: Pap taken.  Bimanual normal.  Musculoskeletal: She exhibits no edema.  Lymphadenopathy:    She has no cervical adenopathy.  Neurological: She is alert and oriented to person, place, and time. She displays normal reflexes. No cranial nerve deficit or sensory deficit. She exhibits normal muscle  tone. Coordination normal.  Skin: Skin is warm and dry. She is not diaphoretic.  Psychiatric: She has a normal mood and affect. Her behavior is normal. Judgment and thought content normal.  Affect is flat.  Clearly stressed about parents situation and is unhappy about it.  Does not want to sell her house in [place].  They will not move to an assisted living facility.  Vitals  reviewed.         Assessment & Plan:  Normal health letter is exam  Hyperlipidemia-pure hypercholesterolemia-restart Lipitor and follow-up in 3 months  Herpes simplex type I-refill Valtrex  History of migraine headaches  Allergic rhinitis  Anxiety depression treated by Dr. Evelene CroonKaur  History of attention disorder treated with Adderall by Dr. Evelene CroonKaur  Situational stress with parents-discussed at length today  Plan: Restart Lipitor and return in 3 months.

## 2018-05-28 LAB — TEST AUTHORIZATION

## 2018-05-28 LAB — LIPID PANEL
CHOL/HDL RATIO: 2.4 (calc) (ref <5.0)
CHOLESTEROL: 245 mg/dL — AB (ref <200)
HDL: 101 mg/dL (ref >50)
LDL CHOLESTEROL (CALC): 132 mg/dL — AB
Non-HDL Cholesterol (Calc): 144 mg/dL (calc) — ABNORMAL HIGH (ref <130)
Triglycerides: 38 mg/dL (ref <150)

## 2018-05-28 LAB — COMPLETE METABOLIC PANEL WITH GFR
AG RATIO: 1.8 (calc) (ref 1.0–2.5)
ALKALINE PHOSPHATASE (APISO): 63 U/L (ref 33–115)
ALT: 13 U/L (ref 6–29)
AST: 19 U/L (ref 10–35)
Albumin: 4.8 g/dL (ref 3.6–5.1)
BILIRUBIN TOTAL: 0.4 mg/dL (ref 0.2–1.2)
BUN: 19 mg/dL (ref 7–25)
CHLORIDE: 105 mmol/L (ref 98–110)
CO2: 29 mmol/L (ref 20–32)
Calcium: 9.8 mg/dL (ref 8.6–10.2)
Creat: 0.97 mg/dL (ref 0.50–1.10)
GFR, Est African American: 80 mL/min/{1.73_m2} (ref >=60)
GFR, Est Non African American: 69 mL/min/{1.73_m2} (ref >=60)
Globulin: 2.7 g/dL (calc) (ref 1.9–3.7)
Glucose, Bld: 96 mg/dL (ref 65–99)
POTASSIUM: 4.3 mmol/L (ref 3.5–5.3)
SODIUM: 140 mmol/L (ref 135–146)
Total Protein: 7.5 g/dL (ref 6.1–8.1)

## 2018-05-28 LAB — CBC WITH DIFFERENTIAL/PLATELET
BASOS PCT: 0.9 %
Basophils Absolute: 29 cells/uL (ref 0–200)
EOS ABS: 29 {cells}/uL (ref 15–500)
Eosinophils Relative: 0.9 %
HCT: 36.1 % (ref 35.0–45.0)
Hemoglobin: 12.4 g/dL (ref 11.7–15.5)
Lymphs Abs: 1389 cells/uL (ref 850–3900)
MCH: 32.1 pg (ref 27.0–33.0)
MCHC: 34.3 g/dL (ref 32.0–36.0)
MCV: 93.5 fL (ref 80.0–100.0)
MONOS PCT: 11.1 %
MPV: 10.3 fL (ref 7.5–12.5)
NEUTROS PCT: 43.7 %
Neutro Abs: 1398 cells/uL — ABNORMAL LOW (ref 1500–7800)
PLATELETS: 270 10*3/uL (ref 140–400)
RBC: 3.86 10*6/uL (ref 3.80–5.10)
RDW: 11.9 % (ref 11.0–15.0)
TOTAL LYMPHOCYTE: 43.4 %
WBC: 3.2 10*3/uL — AB (ref 3.8–10.8)
WBCMIX: 355 {cells}/uL (ref 200–950)

## 2018-05-28 LAB — TSH: TSH: 1.52 mIU/L

## 2018-05-28 LAB — HIV ANTIBODY (ROUTINE TESTING W REFLEX): HIV 1&2 Ab, 4th Generation: NONREACTIVE

## 2018-05-28 LAB — HEPATITIS C ANTIBODY
Hepatitis C Ab: NONREACTIVE
SIGNAL TO CUT-OFF: 0 (ref <1.00)

## 2018-05-28 LAB — VITAMIN D 25 HYDROXY (VIT D DEFICIENCY, FRACTURES): Vit D, 25-Hydroxy: 51 ng/mL (ref 30–100)

## 2018-05-31 LAB — CYTOLOGY - PAP
CHLAMYDIA, DNA PROBE: NEGATIVE
DIAGNOSIS: NEGATIVE
HPV: NOT DETECTED
NEISSERIA GONORRHEA: NEGATIVE

## 2018-06-01 NOTE — Patient Instructions (Addendum)
Please restart Lipitor.  It has been represcribed.  Return for follow-up lipid panel and liver functions in 3 months.  It was a pleasure to see you today.

## 2018-06-03 ENCOUNTER — Other Ambulatory Visit: Payer: Self-pay | Admitting: Internal Medicine

## 2018-08-30 ENCOUNTER — Ambulatory Visit: Payer: 59 | Admitting: Internal Medicine

## 2018-08-30 ENCOUNTER — Encounter: Payer: Self-pay | Admitting: Internal Medicine

## 2018-08-30 VITALS — BP 110/80 | HR 80 | Ht 63.5 in | Wt 169.0 lb

## 2018-08-30 DIAGNOSIS — E7800 Pure hypercholesterolemia, unspecified: Secondary | ICD-10-CM

## 2018-08-30 DIAGNOSIS — F419 Anxiety disorder, unspecified: Secondary | ICD-10-CM

## 2018-08-30 DIAGNOSIS — Z79899 Other long term (current) drug therapy: Secondary | ICD-10-CM | POA: Diagnosis not present

## 2018-08-30 DIAGNOSIS — F32A Depression, unspecified: Secondary | ICD-10-CM

## 2018-08-30 DIAGNOSIS — Z5181 Encounter for therapeutic drug level monitoring: Secondary | ICD-10-CM | POA: Diagnosis not present

## 2018-08-30 DIAGNOSIS — F329 Major depressive disorder, single episode, unspecified: Secondary | ICD-10-CM

## 2018-08-30 LAB — LIPID PANEL
CHOLESTEROL: 182 mg/dL (ref <200)
HDL: 88 mg/dL (ref >50)
LDL Cholesterol (Calc): 81 mg/dL (calc)
Non-HDL Cholesterol (Calc): 94 mg/dL (calc) (ref <130)
TRIGLYCERIDES: 53 mg/dL (ref <150)
Total CHOL/HDL Ratio: 2.1 (calc) (ref <5.0)

## 2018-08-30 LAB — HEPATIC FUNCTION PANEL
AG RATIO: 1.8 (calc) (ref 1.0–2.5)
ALT: 16 U/L (ref 6–29)
AST: 19 U/L (ref 10–35)
Albumin: 4.4 g/dL (ref 3.6–5.1)
Alkaline phosphatase (APISO): 68 U/L (ref 33–115)
BILIRUBIN DIRECT: 0.1 mg/dL (ref 0.0–0.2)
BILIRUBIN INDIRECT: 0.3 mg/dL (ref 0.2–1.2)
BILIRUBIN TOTAL: 0.4 mg/dL (ref 0.2–1.2)
GLOBULIN: 2.5 g/dL (ref 1.9–3.7)
Total Protein: 6.9 g/dL (ref 6.1–8.1)

## 2018-08-30 NOTE — Patient Instructions (Addendum)
Encourage patient to return to see Dr. Milagros Evenerupinder Kaur for consultation and possible medication adjustment.  Continue statin medication.  Physical exam due in August.

## 2018-08-30 NOTE — Progress Notes (Signed)
   Subjective:    Patient ID: Courtney RuizHeather Rallo, female    DOB: [birth year], 47 y.o.   MRN: [id number]  HPI 47 year old Female for follow-up of hyperlipidemia.  Having issues with depression.  Does not feel like getting up many days and is concerned about that.  Her psychiatrist is Dr. Evelene CroonKaur and I suggested she return to her for medication consultation.  Patient is grieving poor health of both parents and is having to the primary caregiver for both of them.  They continue to stay at home.  She is trying to work but this job is new to her and has been somewhat difficult.  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  Does not feel that she is getting support sometimes from her significant other.  Has had discussion with him about this recently.  With regard to hyperlipidemia, her lipid panel is completely normal on lipid-lowering medication which she is been compliant with recently.    Review of Systems fatigue, low energy, flat affect, grieving     Objective:   Physical Exam Reviewed lipid panel which is within normal limits as well as liver functions.  25 minutes with her today counseling regarding situational stress and depression.  Face-to-face time 25 minutes as well as establishing an assessment and plan which was discussed personally with the patient including suggested she get someone to help her part-time with the primary caregiving duties such as housecleaning and laundry so that she may have some free time for herself.       Assessment & Plan:  Hyperlipidemia-continue statin medication and follow-up with physical exam in August.  Anxiety, depression, situational stress, grief.  Patient realizes her parents are never going to be healthy.  Mother has many health problems and has recently started peritoneal dialysis.  Father has memory loss and poor health with many health issues as well.  Plan: Suggested she return to psychiatrist for consultation.  She may  need to consider  counseling with psychologist in addition psychiatric consultation.  Suggested she try to carve out some free time by hiring some help to take the load off of her caregiving duties.

## 2019-01-08 ENCOUNTER — Other Ambulatory Visit: Payer: Self-pay | Admitting: Internal Medicine

## 2019-03-23 ENCOUNTER — Other Ambulatory Visit: Payer: Self-pay

## 2019-03-23 ENCOUNTER — Ambulatory Visit: Payer: 59 | Admitting: Internal Medicine

## 2019-03-23 ENCOUNTER — Encounter: Payer: Self-pay | Admitting: Internal Medicine

## 2019-03-23 VITALS — BP 122/82 | HR 68 | Temp 97.3°F | Resp 16 | Ht 63.5 in | Wt 170.0 lb

## 2019-03-23 DIAGNOSIS — Z111 Encounter for screening for respiratory tuberculosis: Secondary | ICD-10-CM | POA: Diagnosis not present

## 2019-03-23 NOTE — Progress Notes (Signed)
   Subjective:    Patient ID: Courtney Cole, female    DOB: 06-10-1971, 48 y.o.   MRN: [id number]  HPI 48 year old Female in today to have public school health certificate completed which involves hearing and vision screens and screening for tuberculosis.  A QuantiFERON test was drawn today.  Her vision screen was performed by [facility].  I performed her hearing screen which was normal in each ear at 40 dB.  Her mother is currently hospitalized with peritonitis.  Mother undergoes peritoneal dialysis and has had a couple of episodes recently requiring hospitalization.  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  She wants to move her parents up there as well and they are agreeable to this she tells me.  Patient has history of anxiety depression.  She sees Dr. Chucky May for medication Which is related to situational stress with her parents neither of whom are in good health.  Patient reports no new complaints.  She seems happy with her decision.   Review of Systems see above     Objective:   Physical Exam Hearing screen is normal in each ear at 40 dB.  Vision screen is within normal limits. QuantiFERON test drawn today.      Assessment & Plan:  Health certificate form completed for [employer] with QuantiFERON test results pending.  Needs to be updated in 2023.  Recommend annual flu vaccine.

## 2019-03-23 NOTE — Patient Instructions (Signed)
It has been a pleasure to have you is a patient in this practice.  Wishing you good luck in your new job in [place].  We are happy to transfer records to the physician of your choice there with written permission.  Health certificate completed.  QuantiFERON test drawn.

## 2019-03-25 LAB — QUANTIFERON-TB GOLD PLUS
Mitogen-NIL: 8.08 IU/mL
NIL: 0.02 IU/mL
QuantiFERON-TB Gold Plus: NEGATIVE
TB1-NIL: 0.02 IU/mL
TB2-NIL: 0.07 IU/mL

## 2019-04-28 ENCOUNTER — Encounter: Payer: Self-pay | Admitting: Emergency Medicine

## 2019-04-28 ENCOUNTER — Emergency Department
Admission: EM | Admit: 2019-04-28 | Discharge: 2019-04-28 | Disposition: A | Discharge status: Home / Self Care | Payer: 59 | Attending: Emergency Medicine | Admitting: Emergency Medicine

## 2019-04-28 ENCOUNTER — Other Ambulatory Visit: Payer: Self-pay

## 2019-04-28 DIAGNOSIS — Z79899 Other long term (current) drug therapy: Secondary | ICD-10-CM | POA: Insufficient documentation

## 2019-04-28 DIAGNOSIS — W540XXA Bitten by dog, initial encounter: Secondary | ICD-10-CM | POA: Insufficient documentation

## 2019-04-28 DIAGNOSIS — S0185XA Open bite of other part of head, initial encounter: Secondary | ICD-10-CM

## 2019-04-28 DIAGNOSIS — S01511A Laceration without foreign body of lip, initial encounter: Secondary | ICD-10-CM | POA: Insufficient documentation

## 2019-04-28 DIAGNOSIS — Y939 Activity, unspecified: Secondary | ICD-10-CM | POA: Insufficient documentation

## 2019-04-28 DIAGNOSIS — Y999 Unspecified external cause status: Secondary | ICD-10-CM | POA: Insufficient documentation

## 2019-04-28 DIAGNOSIS — S01411A Laceration without foreign body of right cheek and temporomandibular area, initial encounter: Secondary | ICD-10-CM | POA: Insufficient documentation

## 2019-04-28 DIAGNOSIS — Y929 Unspecified place or not applicable: Secondary | ICD-10-CM | POA: Insufficient documentation

## 2019-04-28 DIAGNOSIS — S0181XA Laceration without foreign body of other part of head, initial encounter: Secondary | ICD-10-CM

## 2019-04-28 MED ORDER — BACITRACIN ZINC 500 UNIT/GM EX OINT
TOPICAL_OINTMENT | Freq: Once | CUTANEOUS | Status: AC
  Administered 2019-04-28: 1 via TOPICAL
  Filled 2019-04-28: qty 0.9

## 2019-04-28 MED ORDER — AMOXICILLIN-POT CLAVULANATE 875-125 MG PO TABS: 1.0000 | ORAL_TABLET | Freq: Two times a day (BID) | ORAL | 0 refills | Status: DC

## 2019-04-28 MED ORDER — OXYCODONE-ACETAMINOPHEN 5-325 MG PO TABS: 1.0000 | ORAL_TABLET | Freq: Once | ORAL | Status: DC

## 2019-04-28 MED ORDER — AMOXICILLIN-POT CLAVULANATE 875-125 MG PO TABS
1.0000 | ORAL_TABLET | Freq: Once | ORAL | Status: AC
  Administered 2019-04-28: 1 via ORAL
  Filled 2019-04-28: qty 1

## 2019-04-28 MED ORDER — HYDROCODONE-ACETAMINOPHEN 5-325 MG PO TABS
1.0000 | ORAL_TABLET | Freq: Once | ORAL | Status: AC
  Administered 2019-04-28: 1 via ORAL
  Filled 2019-04-28: qty 1

## 2019-04-28 MED ORDER — HYDROCODONE-ACETAMINOPHEN 5-325 MG PO TABS: 1.0000 | ORAL_TABLET | Freq: Three times a day (TID) | ORAL | 0 refills | Status: AC | PRN

## 2019-04-28 MED ORDER — LIDOCAINE HCL (PF) 1 % IJ SOLN
10.0000 mL | Freq: Once | INTRAMUSCULAR | Status: AC
  Administered 2019-04-28: 10 mL
  Filled 2019-04-28: qty 10

## 2019-04-28 NOTE — ED Notes (Signed)
See triage note  Presents s/p animal bite   States she was bitten by her friends dog   Deep laceration to right side of lip and a few small area noted to side of nose

## 2019-04-28 NOTE — ED Triage Notes (Signed)
Pt to ED via POV after being bit by a dog in the face. Pt has deep laceration to the right upper lip and laceration to the face as well. Bleeding is controlled at this time. Pt states that dog is UTD on vaccines. Pt is in NAD.

## 2019-04-28 NOTE — ED Provider Notes (Signed)
[facility] Emergency Department Provider Note ____________________________________________  Time seen: 1820  I have reviewed the triage vital signs and the nursing notes.  HISTORY  Chief Complaint  Animal Bite  HPI Courtney Cole is a 48 y.o. female presents to the ED for evaluation management of a dog bite to the face.  Patient was bitten on the upper lip, lower lip, and right cheek by her friend's dog.  In the provoked attack, the patient had kneeled down to pet the dog, when the dog lunged and bit her in the face.  She presents now with bleeding controlled the defect to the right side of the upper lip as well as a defect to the left corner of the lower lip.  She also has a laceration across the right cheek.  She denies any other injury at this time.  Patient reports a current tetanus status.  Past Medical History:  Diagnosis Date  . Abnormal Pap smear   . Anxiety   . Depression   . Herpes simplex virus type 1 (HSV-1) dermatitis   . Personal history of kidney stones     Patient Active Problem List   Diagnosis Date Noted  . Anxiety and depression 11/02/2015  . History of migraine headaches 11/02/2015  . Attention deficit disorder 11/02/2015  . Herpes simplex type 1 infection 11/02/2015  . Hyperlipidemia 07/19/2014  . Recovering alcoholic in remission (HCC) 04/03/2014  . Allergic rhinitis 12/03/2011    Past Surgical History:  Procedure Laterality Date  . ADENOIDECTOMY     as child  . ANTERIOR CERVICAL DECOMP/DISCECTOMY FUSION  02/11/2012   Procedure: ANTERIOR CERVICAL DECOMPRESSION/DISCECTOMY FUSION 2 LEVELS;  Surgeon: Maeola HarmanJoseph Stern, MD;  Location: [facility];  Service: Neurosurgery;  Laterality: N/A;  Cervical Five-Six, Cervical Six-Seven anterior cervical decompression with fusion interbody prothesis plating and bonegraft  . LEEP  1996  . TUBAL LIGATION  2005    Prior to Admission medications   Medication Sig Start Date End Date Taking?  Authorizing Provider  ALPRAZolam Prudy Feeler(XANAX) 1 MG tablet Take 1 mg by mouth at bedtime as needed. For sleep 11/05/11  Yes [provider]  amphetamine-dextroamphetamine (ADDERALL) 20 MG tablet Take 20 mg by mouth daily.   Yes [provider]  atorvastatin (LIPITOR) 10 MG tablet TAKE 1 TABLET(10 MG) BY MOUTH DAILY Patient taking differently: Take 10 mg by mouth daily at 6 PM.  01/08/19  Yes Baxley, Luanna ColeMary J, MD  BuPROPion HBr (APLENZIN) 522 MG TB24 Take 522 mg by mouth daily.    Yes [provider]  FLUoxetine (PROZAC) 40 MG capsule Take 1 capsule by mouth daily.  05/02/18  Yes [provider]  valACYclovir (VALTREX) 1000 MG tablet TAKE 1/2 TABLET(500 MG) BY MOUTH DAILY Patient taking differently: Take 500 mg by mouth daily.  06/03/18  Yes BaxleyLuanna Cole, Mary J, MD  amoxicillin-clavulanate (AUGMENTIN) 875-125 MG tablet Take 1 tablet by mouth 2 (two) times daily. 04/28/19   Zarie Kosiba, Charlesetta IvoryJenise V Bacon, PA-C  HYDROcodone-acetaminophen (NORCO) 5-325 MG tablet Take 1 tablet by mouth 3 (three) times daily as needed for up to 3 days. 04/28/19 05/01/19  Mimie Goering, Charlesetta IvoryJenise V Bacon, PA-C    Allergies Erythromycin  Family History  Problem Relation Age of Onset  . Hypertension Mother   . Hyperlipidemia Mother   . Diabetes Mother   . Kidney disease Mother   . Anesthesia problems Neg Hx     Social History Social History   Tobacco Use  . Smoking status: Never Smoker  .  Smokeless tobacco: Never Used  Substance Use Topics  . Alcohol use: No    Comment: h/o etoh abuse ; none since 2006  . Drug use: No    Review of Systems  Constitutional: Negative for fever. Eyes: Negative for visual changes. ENT: Negative for sore throat. Cardiovascular: Negative for chest pain. Respiratory: Negative for shortness of breath. Gastrointestinal: Negative for abdominal pain, vomiting and diarrhea. Genitourinary: Negative for dysuria. Musculoskeletal: Negative for back pain. Skin: Negative for rash.   Facial lacerations as above. Neurological: Negative for headaches, focal weakness or numbness. ____________________________________________  PHYSICAL EXAM:  VITAL SIGNS: ED Triage Vitals [04/28/19 1746]  Enc Vitals Group     BP 131/79     Pulse Rate 94     Resp 16     Temp 98.4 F (36.9 C)     Temp Source Oral     SpO2 97 %     Weight 170 lb (77.1 kg)     Height 5\' 4"  (1.626 m)     Head Circumference      Peak Flow      Pain Score 9     Pain Loc      Pain Edu?      Excl. in GC?     Constitutional: Alert and oriented. Well appearing and in no distress. Head: Normocephalic and atraumatic, except a laceration to the right cheek. Eyes: Conjunctivae are normal. Normal extraocular movements Nose: No congestion/rhinorrhea/epistaxis. Mouth/Throat: Mucous membranes are moist. Upper lip with a laceration extending vertically through the vermilion border. The lower lip has a horizontal laceration at the left lateral region. Cardiovascular: Normal rate, regular rhythm. Normal distal pulses. Respiratory: Normal respiratory effort. No wheezes/rales/rhonchi. Musculoskeletal: Nontender with normal range of motion in all extremities.  Neurologic:  Normal gait without ataxia. Normal speech and language. No gross focal neurologic deficits are appreciated. Skin:  Skin is warm, dry and intact. No rash noted. Psychiatric: Mood is tearful and affect is anxious. Patient exhibits appropriate insight and judgment. ____________________________________________  PROCEDURES  Augmentin 875 mg PO Norco 5-325 mg PO  .Marland Kitchen.Laceration Repair  Date/Time: 04/28/2019 8:26 PM Performed by: Lissa HoardMenshew, Dacota Devall V Bacon, PA-C Authorized by: Lissa HoardMenshew, Shamela Haydon V Bacon, PA-C   Consent:    Consent obtained:  Verbal   Consent given by:  Patient   Risks discussed:  Infection, poor cosmetic result, poor wound healing, pain and need for additional repair   Alternatives discussed:  Delayed treatment and referral Anesthesia  (see MAR for exact dosages):    Anesthesia method:  Local infiltration   Local anesthetic:  Lidocaine 1% w/o epi Laceration details:    Location:  Lip   Lip location:  Upper lip, full thickness (and lower lip, full thickness)   Vermilion border involved: yes     Depth (mm):  5 Repair type:    Repair type:  Simple Pre-procedure details:    Preparation:  Patient was prepped and draped in usual sterile fashion Exploration:    Hemostasis achieved with:  Direct pressure   Contaminated: no   Treatment:    Area cleansed with:  Betadine and saline   Amount of cleaning:  Extensive   Irrigation solution:  Sterile saline   Irrigation method:  Tap Mucous membrane repair:    Suture size:  6-0   Suture material:  Vicryl   Suture technique:  Simple interrupted   Number of sutures:  4 Skin repair:    Repair method:  Sutures   Suture size:  6-0  Suture technique:  Simple interrupted   Number of sutures:  12 Approximation:    Approximation:  Close   Vermilion border: well-aligned   Post-procedure details:    Dressing:  Open (no dressing)   Patient tolerance of procedure:  Tolerated well, no immediate complications   ____________________________________________  INITIAL IMPRESSION / ASSESSMENT AND PLAN / ED COURSE  Courtney Cole was evaluated in Emergency Department on 04/28/2019 for the symptoms described in the history of present illness. She was evaluated in the context of the global COVID-19 pandemic, which necessitated consideration that the patient might be at risk for infection with the SARS-CoV-2 virus that causes COVID-19. Institutional protocols and algorithms that pertain to the evaluation of patients at risk for COVID-19 are in a state of rapid change based on information released by regulatory bodies including the CDC and federal and state organizations. These policies and algorithms were followed during the patient's care in the ED.  Patient with ED evaluation and management  of a dog bite to the face. She sustained lacerations & abrasions to the cheek and lips, including the vermilion border.  The lacerations were repaired using sutures, and the wound edges were well approximated.  Patient is discharged with wound care instructions and supplies.  She is again notified that the wound care, suture repair, and the appropriate antibiotic given could still result wound infection, dehiscence, and need for further revision.  Patient discharged with prescriptions for #9 hydrocodone as well as Augmentin to take as directed.  Her tetanus up-to-date, she will follow with primary provider for interim wound check and suture removal in 5 to 7 days.  I reviewed the patient's prescription history over the last 12 months in the multi-state controlled substances database(s) that includes [place], [place], [place], [place], [place], [place], [place], [place], [place], [place], [place], [place], [place], and [place].  Results were notable for regular BZD and ADHD medicine. ____________________________________________  FINAL CLINICAL IMPRESSION(S) / ED DIAGNOSES  Final diagnoses:  Dog bite of face, initial encounter  Facial laceration, initial encounter      Melvenia Needles, PA-C 04/28/19 2101    Arta Silence, MD 04/29/19 [phone]

## 2019-04-28 NOTE — Discharge Instructions (Signed)
You have had your facial/lip lacerations repaired using sutures. Keep the areas clean and covered with a small amount of antibiotic ointment. Follow-up with your PCP or a local urgent/emergency center for suture removal. Take the antibiotics as directed. Take the pain medicine as needed.

## 2019-06-28 ENCOUNTER — Encounter: Payer: Self-pay | Admitting: Internal Medicine

## 2019-06-28 ENCOUNTER — Other Ambulatory Visit: Payer: Self-pay | Admitting: Internal Medicine

## 2019-06-28 ENCOUNTER — Telehealth: Payer: Self-pay | Admitting: Internal Medicine

## 2019-06-28 NOTE — Telephone Encounter (Signed)
Have denied refills until pt contacts [place]. Due for CPE November 2020 but has moved to [place]. Cannot refill meds until CPE booked. However, it may be better for her to have local MD in [place].

## 2019-06-29 NOTE — Telephone Encounter (Signed)
Pt called back I explained to her what she would need to do to have meds filled, She said she will try and find an MD closer to her

## 2019-08-02 ENCOUNTER — Telehealth: Payer: Self-pay | Admitting: Internal Medicine

## 2019-08-02 NOTE — Telephone Encounter (Signed)
LVM to CB and schedule CPE and Labs °

## 2019-08-11 ENCOUNTER — Other Ambulatory Visit: Payer: Self-pay | Admitting: Internal Medicine

## 2019-08-11 MED ORDER — VALACYCLOVIR HCL 1 G PO TABS: ORAL_TABLET | ORAL | 3 refills | Status: DC

## 2019-08-11 NOTE — Telephone Encounter (Signed)
Courtney Cole [phone]  [facility]  valACYclovir (VALTREX) 1000 MG tablet    Vaudie called to say she has a fever blister and would like a refill on above medication.  Last OV 03/23/19  Last CPE 05/23/18  Next CPE scheduled for 11/20/2019

## 2019-08-11 NOTE — Telephone Encounter (Signed)
Patient called back and scheduled appointment

## 2019-11-17 ENCOUNTER — Other Ambulatory Visit: Payer: 59 | Admitting: Internal Medicine

## 2019-11-20 ENCOUNTER — Encounter: Payer: 59 | Admitting: Internal Medicine

## 2020-01-29 ENCOUNTER — Ambulatory Visit: Payer: BC Managed Care – PPO | Admitting: Internal Medicine

## 2020-01-29 ENCOUNTER — Encounter: Payer: Self-pay | Admitting: Internal Medicine

## 2020-01-29 ENCOUNTER — Other Ambulatory Visit (HOSPITAL_COMMUNITY)
Admission: RE | Admit: 2020-01-29 | Discharge: 2020-01-29 | Disposition: A | Discharge status: Home / Self Care | Payer: BC Managed Care – PPO | Source: Ambulatory Visit | Attending: Internal Medicine | Admitting: Internal Medicine

## 2020-01-29 ENCOUNTER — Other Ambulatory Visit: Payer: Self-pay

## 2020-01-29 VITALS — BP 140/90 | HR 83 | Temp 98.3°F | Ht 63.5 in | Wt 166.0 lb

## 2020-01-29 DIAGNOSIS — Z8669 Personal history of other diseases of the nervous system and sense organs: Secondary | ICD-10-CM

## 2020-01-29 DIAGNOSIS — Z0000 Encounter for general adult medical examination without abnormal findings: Secondary | ICD-10-CM | POA: Diagnosis not present

## 2020-01-29 DIAGNOSIS — E7800 Pure hypercholesterolemia, unspecified: Secondary | ICD-10-CM

## 2020-01-29 DIAGNOSIS — F329 Major depressive disorder, single episode, unspecified: Secondary | ICD-10-CM

## 2020-01-29 DIAGNOSIS — F419 Anxiety disorder, unspecified: Secondary | ICD-10-CM | POA: Diagnosis not present

## 2020-01-29 DIAGNOSIS — Z124 Encounter for screening for malignant neoplasm of cervix: Secondary | ICD-10-CM | POA: Insufficient documentation

## 2020-01-29 DIAGNOSIS — F1021 Alcohol dependence, in remission: Secondary | ICD-10-CM

## 2020-01-29 DIAGNOSIS — Z634 Disappearance and death of family member: Secondary | ICD-10-CM

## 2020-01-29 DIAGNOSIS — Z1231 Encounter for screening mammogram for malignant neoplasm of breast: Secondary | ICD-10-CM

## 2020-01-29 DIAGNOSIS — Z13220 Encounter for screening for lipoid disorders: Secondary | ICD-10-CM

## 2020-01-29 DIAGNOSIS — Z1329 Encounter for screening for other suspected endocrine disorder: Secondary | ICD-10-CM

## 2020-01-29 DIAGNOSIS — F988 Other specified behavioral and emotional disorders with onset usually occurring in childhood and adolescence: Secondary | ICD-10-CM

## 2020-01-29 DIAGNOSIS — F32A Depression, unspecified: Secondary | ICD-10-CM

## 2020-01-29 DIAGNOSIS — F439 Reaction to severe stress, unspecified: Secondary | ICD-10-CM

## 2020-01-29 DIAGNOSIS — B009 Herpesviral infection, unspecified: Secondary | ICD-10-CM

## 2020-01-29 LAB — POCT URINALYSIS DIPSTICK
Appearance: NEGATIVE
Bilirubin, UA: NEGATIVE
Blood, UA: NEGATIVE
Glucose, UA: NEGATIVE
Ketones, UA: NEGATIVE
Leukocytes, UA: NEGATIVE
Nitrite, UA: NEGATIVE
Odor: NEGATIVE
Protein, UA: NEGATIVE
Spec Grav, UA: 1.01 (ref 1.010–1.025)
Urobilinogen, UA: 0.2 E.U./dL
pH, UA: 6.5 (ref 5.0–8.0)

## 2020-01-29 MED ORDER — VALACYCLOVIR HCL 1 G PO TABS: ORAL_TABLET | ORAL | 3 refills | Status: DC

## 2020-01-29 MED ORDER — ATORVASTATIN CALCIUM 10 MG PO TABS: 10.0000 mg | ORAL_TABLET | Freq: Every day | ORAL | 3 refills | Status: DC

## 2020-01-29 NOTE — Progress Notes (Signed)
Subjective:    Patient ID: Courtney Cole, female    DOB: July 11, 1971, 49 y.o.   MRN: [id number]  HPI 49 year old Female seen for health maintenance exam and evaluation of medical issues.   She had Covid-19 as did both parents. Mother was hospitalized with Covid-19 here and had complicated course but was never on vent. Mother returned home with patient to [facility] and could not get into rehab facility as she was still testing positive for Covid-19. Evidently had acute event with lung congestion- was hospitalized acutely and did not survive.   Father lives here and has dementia requiring full time help. 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 Likes her job but year has been tough emotionally with parents' illnesses.  She has hx of hyperlipidemia. Is out of lipid medication so fasting lipid panel today will reflect that.  [facility] wants [phone] for Vitamin D level so we did not do this test today and have asked her just to make sure she is taking 4000 units daily over the counter Vitamin D supplement for now.  Order placed for mammogram.  She has a history of HSV type I and takes Valtrex.  This was refilled.  Abnormal Pap smear in 1996 status post LEEP procedure.  History of depression treated with Prozac.  Needing to take Xanax at night to get sleep frequently since mother passed away.  Sees Dr. Milagros Evener for these medications.  Has been allergy tested by Dr. Beaulah Dinning in 2008.  Was allergic to mold and was thought to have element of GE reflux.  Was placed on PPI.  Also was prescribed albuterol, Astelin nasal spray, Nasonex spray.  Spirometry was normal.  No changes noted after using bronchodilator.  In 2006 she saw Dr. Zachery Conch, neurologist for migraine headaches and was placed on Topamax but no longer takes it.  Patient was hospitalized with alcohol and drug overdose December 2000.  Upon discharge she was referred to outpatient psychiatric program.  She has a remote history  of alcoholism.  No other history of serious illnesses or accidents.  Another trauma she suffered earlier this year was she found her long-term female friend deceased in his home from apparent natural causes.  Social history: She does not smoke.  She has a Event organiser.  Marriage ended in divorce.  No children.  She is an alcoholic in recovery and has done well.  Family history: Mother with history of hypertension, hyperlipidemia, diabetes, obesity, breast cancer, urticaria, depression, renal failure, dialysis died with complications of COVID-19 earlier this year.  Father with history of coronary artery disease, MR, and dementia.  No siblings.  In July 2020 he had dog bite to face seen at emergency department.  Has had 2 COVID-19 immunizations and last one was on April 22.  Tetanus immunization done in 2013.  Review of Systems menopausal at age 70.  She is tearful in the office today relating issues regarding mother and father in their health.     Objective:   Physical Exam Blood pressure 140/90 pulse 83 temperature 98.3 degrees orally pulse oximetry 98% weight 166 pounds BMI 28.94 Skin warm and dry.  Nodes none.  Neck is supple without JVD thyromegaly or carotid bruits.  Breasts normal female without masses.  Cardiac exam regular rate and rhythm normal S1 and S2 without murmurs.  Abdomen soft nondistended without hepatosplenomegaly masses or tenderness.  Extremities without deformity.  Neuro intact without focal deficits.  Affect thought and judgment appear to  be normal.  She is tearful in the office today.  Pap was taken in 2019.  Pap repeated today.  Bimanual is normal.        Assessment & Plan:  Situational stress with father who has memory issues and continues to live in [place] while patient is residing in [place] secondary to mother's passing from complications of [id number] with multiple medical issues earlier this year.  Affect and bereavement appropriate.  She  continues to see Dr. Toy Care.  Hyperlipidemia-has been off lipid-lowering medication.  Fasting labs drawn and pending.  Restart statin medication and follow-up in 6 months.  Health maintenance-needs mammogram.  Order placed.  Advised patient to take 4000 units vitamin D3 daily.  Vitamin D level was not done today due to expense.  Plan: I would like for her to return in 6 months for fasting lipid panel and liver functions on lipid-lowering medication.  Otherwise return in 1 year or as needed.

## 2020-01-29 NOTE — Patient Instructions (Addendum)
Vitamin D level not done due to expense. Take 4000 units D3 daily. Restart Lipitor and follow up in 6 months with lipid panel and liver functions without OV when you are here visiting. Have mammogram. Order placed. Valtrex refilled. Colonoscopy is due.

## 2020-01-29 NOTE — Addendum Note (Signed)
Addended by: Gregery Na on: 01/29/2020 05:24 PM   Modules accepted: Orders

## 2020-01-30 LAB — CBC WITH DIFFERENTIAL/PLATELET
Absolute Monocytes: 479 cells/uL (ref 200–950)
Basophils Absolute: 20 cells/uL (ref 0–200)
Basophils Relative: 0.6 %
Eosinophils Absolute: 20 cells/uL (ref 15–500)
Eosinophils Relative: 0.6 %
HCT: 38.8 % (ref 35.0–45.0)
Hemoglobin: 13 g/dL (ref 11.7–15.5)
Lymphs Abs: 1139 cells/uL (ref 850–3900)
MCH: 31.9 pg (ref 27.0–33.0)
MCHC: 33.5 g/dL (ref 32.0–36.0)
MCV: 95.3 fL (ref 80.0–100.0)
MPV: 10.1 fL (ref 7.5–12.5)
Monocytes Relative: 14.1 %
Neutro Abs: 1741 cells/uL (ref 1500–7800)
Neutrophils Relative %: 51.2 %
Platelets: 279 10*3/uL (ref 140–400)
RBC: 4.07 10*6/uL (ref 3.80–5.10)
RDW: 12 % (ref 11.0–15.0)
Total Lymphocyte: 33.5 %
WBC: 3.4 10*3/uL — ABNORMAL LOW (ref 3.8–10.8)

## 2020-01-30 LAB — LIPID PANEL
Cholesterol: 215 mg/dL — ABNORMAL HIGH (ref <200)
HDL: 77 mg/dL (ref >=50)
LDL Cholesterol (Calc): 122 mg/dL (calc) — ABNORMAL HIGH
Non-HDL Cholesterol (Calc): 138 mg/dL (calc) — ABNORMAL HIGH (ref <130)
Total CHOL/HDL Ratio: 2.8 (calc) (ref <5.0)
Triglycerides: 70 mg/dL (ref <150)

## 2020-01-30 LAB — COMPLETE METABOLIC PANEL WITH GFR
AG Ratio: 1.5 (calc) (ref 1.0–2.5)
ALT: 15 U/L (ref 6–29)
AST: 20 U/L (ref 10–35)
Albumin: 4.4 g/dL (ref 3.6–5.1)
Alkaline phosphatase (APISO): 68 U/L (ref 37–153)
BUN: 16 mg/dL (ref 7–25)
CO2: 28 mmol/L (ref 20–32)
Calcium: 9.8 mg/dL (ref 8.6–10.4)
Chloride: 100 mmol/L (ref 98–110)
Creat: 0.87 mg/dL (ref 0.50–1.05)
GFR, Est African American: 90 mL/min/{1.73_m2} (ref >=60)
GFR, Est Non African American: 78 mL/min/{1.73_m2} (ref >=60)
Globulin: 2.9 g/dL (calc) (ref 1.9–3.7)
Glucose, Bld: 101 mg/dL — ABNORMAL HIGH (ref 65–99)
Potassium: 3.8 mmol/L (ref 3.5–5.3)
Sodium: 136 mmol/L (ref 135–146)
Total Bilirubin: 0.3 mg/dL (ref 0.2–1.2)
Total Protein: 7.3 g/dL (ref 6.1–8.1)

## 2020-01-30 LAB — CYTOLOGY - PAP
Comment: NEGATIVE
Diagnosis: NEGATIVE
High risk HPV: NEGATIVE

## 2020-01-30 LAB — TSH: TSH: 1.37 mIU/L

## 2021-02-03 ENCOUNTER — Other Ambulatory Visit: Payer: Self-pay | Admitting: Internal Medicine

## 2021-02-04 NOTE — Telephone Encounter (Signed)
Left message needs to call me back.  

## 2021-02-04 NOTE — Telephone Encounter (Signed)
Past due for CPE. Please contact her regarding appointment before we can refill Last seen April 2021

## 2021-04-14 ENCOUNTER — Other Ambulatory Visit: Payer: BC Managed Care – PPO | Admitting: Internal Medicine

## 2021-04-18 ENCOUNTER — Encounter: Payer: BC Managed Care – PPO | Admitting: Internal Medicine

## 2021-05-18 ENCOUNTER — Other Ambulatory Visit: Payer: Self-pay | Admitting: Internal Medicine

## 2021-07-21 ENCOUNTER — Other Ambulatory Visit: Payer: BC Managed Care – PPO | Admitting: Internal Medicine

## 2021-07-21 ENCOUNTER — Other Ambulatory Visit: Payer: Self-pay

## 2021-07-21 DIAGNOSIS — F419 Anxiety disorder, unspecified: Secondary | ICD-10-CM

## 2021-07-21 DIAGNOSIS — F32A Depression, unspecified: Secondary | ICD-10-CM

## 2021-07-21 DIAGNOSIS — E7800 Pure hypercholesterolemia, unspecified: Secondary | ICD-10-CM

## 2021-07-21 DIAGNOSIS — Z0000 Encounter for general adult medical examination without abnormal findings: Secondary | ICD-10-CM

## 2021-07-21 DIAGNOSIS — Z1329 Encounter for screening for other suspected endocrine disorder: Secondary | ICD-10-CM

## 2021-07-22 ENCOUNTER — Ambulatory Visit (INDEPENDENT_AMBULATORY_CARE_PROVIDER_SITE_OTHER): Payer: BC Managed Care – PPO | Admitting: Internal Medicine

## 2021-07-22 ENCOUNTER — Encounter: Payer: Self-pay | Admitting: Internal Medicine

## 2021-07-22 VITALS — BP 118/76 | HR 92 | Temp 97.7°F | Resp 16 | Ht 63.5 in | Wt 166.0 lb

## 2021-07-22 DIAGNOSIS — F419 Anxiety disorder, unspecified: Secondary | ICD-10-CM | POA: Diagnosis not present

## 2021-07-22 DIAGNOSIS — Z0000 Encounter for general adult medical examination without abnormal findings: Secondary | ICD-10-CM

## 2021-07-22 DIAGNOSIS — B009 Herpesviral infection, unspecified: Secondary | ICD-10-CM | POA: Diagnosis not present

## 2021-07-22 DIAGNOSIS — Z1211 Encounter for screening for malignant neoplasm of colon: Secondary | ICD-10-CM | POA: Diagnosis not present

## 2021-07-22 DIAGNOSIS — Z803 Family history of malignant neoplasm of breast: Secondary | ICD-10-CM

## 2021-07-22 DIAGNOSIS — E7800 Pure hypercholesterolemia, unspecified: Secondary | ICD-10-CM

## 2021-07-22 DIAGNOSIS — F988 Other specified behavioral and emotional disorders with onset usually occurring in childhood and adolescence: Secondary | ICD-10-CM | POA: Diagnosis not present

## 2021-07-22 DIAGNOSIS — Z23 Encounter for immunization: Secondary | ICD-10-CM

## 2021-07-22 DIAGNOSIS — Z8669 Personal history of other diseases of the nervous system and sense organs: Secondary | ICD-10-CM

## 2021-07-22 DIAGNOSIS — N6321 Unspecified lump in the left breast, upper outer quadrant: Secondary | ICD-10-CM

## 2021-07-22 DIAGNOSIS — F32A Depression, unspecified: Secondary | ICD-10-CM

## 2021-07-22 LAB — COMPLETE METABOLIC PANEL WITH GFR
AG Ratio: 1.7 (calc) (ref 1.0–2.5)
ALT: 15 U/L (ref 6–29)
AST: 17 U/L (ref 10–35)
Albumin: 4.7 g/dL (ref 3.6–5.1)
Alkaline phosphatase (APISO): 67 U/L (ref 37–153)
BUN: 15 mg/dL (ref 7–25)
CO2: 30 mmol/L (ref 20–32)
Calcium: 10 mg/dL (ref 8.6–10.4)
Chloride: 102 mmol/L (ref 98–110)
Creat: 0.9 mg/dL (ref 0.50–1.03)
Globulin: 2.7 g/dL (calc) (ref 1.9–3.7)
Glucose, Bld: 110 mg/dL — ABNORMAL HIGH (ref 65–99)
Potassium: 4.7 mmol/L (ref 3.5–5.3)
Sodium: 139 mmol/L (ref 135–146)
Total Bilirubin: 0.5 mg/dL (ref 0.2–1.2)
Total Protein: 7.4 g/dL (ref 6.1–8.1)
eGFR: 77 mL/min/{1.73_m2} (ref >=60)

## 2021-07-22 LAB — CBC WITH DIFFERENTIAL/PLATELET
Absolute Monocytes: 374 cells/uL (ref 200–950)
Basophils Absolute: 20 cells/uL (ref 0–200)
Basophils Relative: 0.6 %
Eosinophils Absolute: 10 cells/uL — ABNORMAL LOW (ref 15–500)
Eosinophils Relative: 0.3 %
HCT: 37.8 % (ref 35.0–45.0)
Hemoglobin: 12.6 g/dL (ref 11.7–15.5)
Lymphs Abs: 1316 cells/uL (ref 850–3900)
MCH: 32.4 pg (ref 27.0–33.0)
MCHC: 33.3 g/dL (ref 32.0–36.0)
MCV: 97.2 fL (ref 80.0–100.0)
MPV: 9.9 fL (ref 7.5–12.5)
Monocytes Relative: 11 %
Neutro Abs: 1680 cells/uL (ref 1500–7800)
Neutrophils Relative %: 49.4 %
Platelets: 304 10*3/uL (ref 140–400)
RBC: 3.89 10*6/uL (ref 3.80–5.10)
RDW: 12.2 % (ref 11.0–15.0)
Total Lymphocyte: 38.7 %
WBC: 3.4 10*3/uL — ABNORMAL LOW (ref 3.8–10.8)

## 2021-07-22 LAB — POCT URINALYSIS DIP (MANUAL ENTRY)
Bilirubin, UA: NEGATIVE
Blood, UA: NEGATIVE
Glucose, UA: NEGATIVE mg/dL
Ketones, POC UA: NEGATIVE mg/dL
Leukocytes, UA: NEGATIVE
Nitrite, UA: NEGATIVE
Protein Ur, POC: NEGATIVE mg/dL
Spec Grav, UA: 1.015 (ref 1.010–1.025)
Urobilinogen, UA: 0.2 E.U./dL
pH, UA: 6 (ref 5.0–8.0)

## 2021-07-22 LAB — LIPID PANEL
Cholesterol: 186 mg/dL (ref <200)
HDL: 90 mg/dL (ref >=50)
LDL Cholesterol (Calc): 80 mg/dL (calc)
Non-HDL Cholesterol (Calc): 96 mg/dL (calc) (ref <130)
Total CHOL/HDL Ratio: 2.1 (calc) (ref <5.0)
Triglycerides: 75 mg/dL (ref <150)

## 2021-07-22 LAB — TSH: TSH: 1.5 mIU/L

## 2021-07-22 NOTE — Patient Instructions (Addendum)
Flu vaccine given by CMA today. Have mammogram and ultrasound- Order written. Continue statin medication. Watch carbohydrates. RTC in one year or as needed. Cologard ordered. Needs colonoscopy at some point when situational stress is better.

## 2021-07-22 NOTE — Progress Notes (Signed)
Subjective:    Patient ID: Courtney Cole, female    DOB: 04/23/1971, 50 y.o.   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  Patient, her mother and her father all had COVID-19 early on in the pandemic.  Mother had prolonged hospitalization.  Patient then moved her to the [place] area where patient was living but she could not get admitted into a rehab facility because she continues to test positive for COVID-19.  Subsequently mother had an acute event was taken to the hospital and expired.  Father lives here.  He has a history of memory loss.  Patient checks on him frequently and she has full-time help for him.  She has a history of hyperlipidemia.  Currently not taking lipid medication.  This was reordered today.  History of HSV type I and takes Valtrex.  This is been refilled.  History of abnormal Pap smear in 1996 status post LEEP procedure.  History of depression treated with Prozac.  Takes Xanax to sleep.  Sees Dr. Milagros Evener for these medications.  Also history of attention deficit and previously took Adderall but currently this is not current medication.  Also takes Wellbutrin 150 mg daily.  Social history: She has a Event organiser.  Marriage ended in divorce.  No children.  She does not smoke.  She had an issue with alcohol abuse a number of years ago.  Has remained in recovery and done well.  Her long-term female friend was found deceased at his home apparently from natural causes in 2021.  She was hospitalized with alcohol and drug overdose in December 2000.  Upon discharge was referred to outpatient psychiatric program.  Remote history of alcoholism.  In 2006 she saw Dr. Zachery Conch, neurologist for migraine headaches and was placed on Topamax but no longer takes it.  Was allergy tested by Dr. Beaulah Dinning in 2008.  Was allergic to mold and was thought to have  an element of reflux.  Was placed on PPI.  Was treated with albuterol inhaler, Astelin nasal spray and Nasonex spray.  Spirometry was normal.  No changes were noted after using bronchodilator.  Family history: Mother had hypertension, hyperlipidemia, diabetes, obesity, breast cancer, urticaria, depression, renal failure, dialysis and died with complications of COVID-19 in 2021.  Father with history of coronary artery disease, MR, and dementia.  No siblings.  In July 2020 patient had dog bite to face and had to be seen at the emergency department.              Review of Systems Pap was done in 2021.  Became menopausal at age 17.     Objective:   Physical Exam Vital signs reviewed.  Blood pressure 118/76 pulse 92 respiratory rate 16 temperature 97.7 pulse oximetry 97% weight 166 pounds BMI 28.94  Skin: Warm and dry.  Nodes none.  TMs clear.  Neck supple.  Chest clear to auscultation.  Cardiac exam: Regular rate and rhythm without ectopy or murmur.  Breasts: Concern regarding thickness in breast.  Order written for mammogram and ultrasound to be done in the [place] area as soon as possible.  Abdomen soft nondistended without hepatosplenomegaly masses or tenderness.  No lower extremity pitting edema.  Pap done in 2021.  Bimanual normal.  Neuro is intact without gross focal deficits.       Assessment & Plan:  Labs reviewed and are  essentially within normal limits.  She will continue with current medications.  TSH is normal.  Lipid panel is normal.  I am concerned about a thickening in her breast.  Have given her a written order for mammogram and ultrasound to be done in [place] area as soon as possible with results sent to me.  Return in 1 year or as needed.  Requested Cologuard.  She does not want to have colonoscopy at this time.  Order was placed.  Thickening is noted in left upper outer quadrant and she has family history of breast cancer in her mother.

## 2021-07-23 LAB — LIPID PANEL

## 2021-07-23 LAB — TSH

## 2021-07-23 LAB — CBC WITH DIFFERENTIAL/PLATELET

## 2021-07-23 LAB — COMPLETE METABOLIC PANEL WITH GFR

## 2021-08-09 LAB — COLOGUARD: COLOGUARD: NEGATIVE

## 2021-08-20 ENCOUNTER — Other Ambulatory Visit: Payer: Self-pay | Admitting: Internal Medicine

## 2022-05-02 ENCOUNTER — Other Ambulatory Visit: Payer: Self-pay | Admitting: Internal Medicine

## 2022-07-17 ENCOUNTER — Other Ambulatory Visit: Payer: BC Managed Care – PPO

## 2022-07-23 ENCOUNTER — Other Ambulatory Visit: Payer: BC Managed Care – PPO

## 2022-07-23 DIAGNOSIS — E7800 Pure hypercholesterolemia, unspecified: Secondary | ICD-10-CM

## 2022-07-23 DIAGNOSIS — F419 Anxiety disorder, unspecified: Secondary | ICD-10-CM

## 2022-07-23 DIAGNOSIS — R5383 Other fatigue: Secondary | ICD-10-CM

## 2022-07-24 ENCOUNTER — Encounter: Payer: Self-pay | Admitting: Internal Medicine

## 2022-07-24 ENCOUNTER — Ambulatory Visit (INDEPENDENT_AMBULATORY_CARE_PROVIDER_SITE_OTHER): Payer: BC Managed Care – PPO | Admitting: Internal Medicine

## 2022-07-24 VITALS — BP 110/82 | HR 76 | Temp 98.0°F | Ht 64.0 in | Wt 176.8 lb

## 2022-07-24 DIAGNOSIS — Z7185 Encounter for immunization safety counseling: Secondary | ICD-10-CM | POA: Diagnosis not present

## 2022-07-24 DIAGNOSIS — F419 Anxiety disorder, unspecified: Secondary | ICD-10-CM | POA: Diagnosis not present

## 2022-07-24 DIAGNOSIS — Z8669 Personal history of other diseases of the nervous system and sense organs: Secondary | ICD-10-CM

## 2022-07-24 DIAGNOSIS — Z8619 Personal history of other infectious and parasitic diseases: Secondary | ICD-10-CM

## 2022-07-24 DIAGNOSIS — F988 Other specified behavioral and emotional disorders with onset usually occurring in childhood and adolescence: Secondary | ICD-10-CM | POA: Diagnosis not present

## 2022-07-24 DIAGNOSIS — Z803 Family history of malignant neoplasm of breast: Secondary | ICD-10-CM

## 2022-07-24 DIAGNOSIS — Z23 Encounter for immunization: Secondary | ICD-10-CM

## 2022-07-24 DIAGNOSIS — Z0000 Encounter for general adult medical examination without abnormal findings: Secondary | ICD-10-CM | POA: Diagnosis not present

## 2022-07-24 DIAGNOSIS — E7800 Pure hypercholesterolemia, unspecified: Secondary | ICD-10-CM

## 2022-07-24 DIAGNOSIS — F32A Depression, unspecified: Secondary | ICD-10-CM

## 2022-07-24 LAB — COMPLETE METABOLIC PANEL WITH GFR
AG Ratio: 1.6 (calc) (ref 1.0–2.5)
ALT: 18 U/L (ref 6–29)
AST: 25 U/L (ref 10–35)
Albumin: 4.8 g/dL (ref 3.6–5.1)
Alkaline phosphatase (APISO): 76 U/L (ref 37–153)
BUN: 14 mg/dL (ref 7–25)
CO2: 27 mmol/L (ref 20–32)
Calcium: 10.4 mg/dL (ref 8.6–10.4)
Chloride: 103 mmol/L (ref 98–110)
Creat: 1.02 mg/dL (ref 0.50–1.03)
Globulin: 3 g/dL (calc) (ref 1.9–3.7)
Glucose, Bld: 87 mg/dL (ref 65–99)
Potassium: 4.6 mmol/L (ref 3.5–5.3)
Sodium: 138 mmol/L (ref 135–146)
Total Bilirubin: 0.5 mg/dL (ref 0.2–1.2)
Total Protein: 7.8 g/dL (ref 6.1–8.1)
eGFR: 66 mL/min/{1.73_m2} (ref >=60)

## 2022-07-24 LAB — CBC WITH DIFFERENTIAL/PLATELET
Absolute Monocytes: 349 cells/uL (ref 200–950)
Basophils Absolute: 29 cells/uL (ref 0–200)
Basophils Relative: 0.7 %
Eosinophils Absolute: 29 cells/uL (ref 15–500)
Eosinophils Relative: 0.7 %
HCT: 39.7 % (ref 35.0–45.0)
Hemoglobin: 13.4 g/dL (ref 11.7–15.5)
Lymphs Abs: 1841 cells/uL (ref 850–3900)
MCH: 32.6 pg (ref 27.0–33.0)
MCHC: 33.8 g/dL (ref 32.0–36.0)
MCV: 96.6 fL (ref 80.0–100.0)
MPV: 10.5 fL (ref 7.5–12.5)
Monocytes Relative: 8.5 %
Neutro Abs: 1853 cells/uL (ref 1500–7800)
Neutrophils Relative %: 45.2 %
Platelets: 319 10*3/uL (ref 140–400)
RBC: 4.11 10*6/uL (ref 3.80–5.10)
RDW: 12.3 % (ref 11.0–15.0)
Total Lymphocyte: 44.9 %
WBC: 4.1 10*3/uL (ref 3.8–10.8)

## 2022-07-24 LAB — LIPID PANEL
Cholesterol: 215 mg/dL — ABNORMAL HIGH (ref <200)
HDL: 83 mg/dL (ref >=50)
LDL Cholesterol (Calc): 114 mg/dL (calc) — ABNORMAL HIGH
Non-HDL Cholesterol (Calc): 132 mg/dL (calc) — ABNORMAL HIGH (ref <130)
Total CHOL/HDL Ratio: 2.6 (calc) (ref <5.0)
Triglycerides: 85 mg/dL (ref <150)

## 2022-07-24 LAB — POCT URINALYSIS DIPSTICK
Bilirubin, UA: NEGATIVE
Blood, UA: NEGATIVE
Glucose, UA: NEGATIVE
Ketones, UA: NEGATIVE
Leukocytes, UA: NEGATIVE
Nitrite, UA: NEGATIVE
Protein, UA: NEGATIVE
Spec Grav, UA: 1.01 (ref 1.010–1.025)
Urobilinogen, UA: 0.2 E.U./dL
pH, UA: 5 (ref 5.0–8.0)

## 2022-07-24 LAB — TSH: TSH: 2.21 mIU/L

## 2022-07-24 MED ORDER — ATORVASTATIN CALCIUM 20 MG PO TABS: 20.0000 mg | ORAL_TABLET | Freq: Every day | ORAL | 3 refills | Status: DC

## 2022-07-24 NOTE — Progress Notes (Signed)
Subjective:    Patient ID: Courtney Cole, female    DOB: 07-Apr-1971, 51 y.o.   MRN: [id number]  HPI 51 year old Female seen for health maintenance exam and evaluation of medical issues.   She has a history of hyperlipidemia.  Total cholesterol is 215, HDL excellent at 83, triglycerides low at 85 and LDL mildly elevated at 114.  Has been on atorvastatin 10 mg daily.  She would like to get her cholesterol under better control so we are increasing it to 20 mg daily and we will follow-up at next visit.  History of HSV type I and takes Valtrex.  History of abnormal Pap smear in 1996 status post LEEP procedure.  History of depression  being treated by Dr. Toy Care.  Social history: Patient is a retired Programmer, multimedia.  She is now living in [place] and more recently lived in the [place] area.  She has a Scientist, water quality.  Her marriage ended in divorce.  No children.  Does not smoke.  Remote history of alcohol abuse.  Has remained in recovery and done well.  History of migraine headaches and saw neurologist in 2006.  Was placed on Topamax but no longer takes it.  Family history: Mother died of complications of SAYTK-16 early on during the pandemic.  Father with history of dementia lives here in [place].  Was allergy tested by  in 2008.  Was allergic to mold and was thought to have an element of reflux.  Was placed on PPI.  Was treated with albuterol inhaler, Astelin nasal spray and Nasonex spray.  Spirometry was normal.  No changes noted after using bronchodilator.  Family history: Mother with history of hypertension, hyperlipidemia, diabetes, obesity, breast cancer, urticaria, depression, renal failure, dialysis.  Father with history of coronary artery disease, mitral regurgitation, dementia.  No siblings.  In July 2020 patient had dog bite to face and had to be seen in the emergency department but recovered.  She became up menopausal at age 34.  Had Cologuard in 2022.  This was negative.  Do  recommend colonoscopy.  Review of Systems no new complaints.  Her treatment with Dr. Toy Care is discussed.      Objective:   Physical Exam Vital signs reviewed.  Skin: Warm and dry.  Nodes none.  TMs clear.  Pharynx clear.  Neck supple.  No thyromegaly.  No carotid bruits.  Chest clear.  Cardiac exam: Regular rate and rhythm.  No murmur or ectopy.  Abdomen is soft, nondistended without hepatosplenomegaly masses or tenderness.  No lower extremity edema.  Brief neurological exam is intact without gross focal deficits.       Assessment & Plan:    Mild elevation of total and LDL cholesterol.  HDL cholesterol is excellent at 83 and triglycerides are normal.  New prescription for atorvastatin 20 mg daily.  History of allergic rhinitis  History of anxiety and depression treated by Dr. Toy Care.  Currently doing well.  Enjoying retirement.  History of HSV type II treated with Valtrex.  Patient is due for mammogram. Order placed at [facility].  Had negative Cologard but needs screening colonoscopy. She needs to let us know  when it is convenient for her to go for this so we can place referral  Plan: May return in 1 year or as needed.  Pneumococcal 20 vaccine given today.  Flu vaccine given today.  Is due for tetanus update as well.  Needs to consider screening colonoscopy.  Needs annual mammogram.  [facility]

## 2022-07-24 NOTE — Patient Instructions (Addendum)
Order for bone density and mammogram at [facility]. Vaccines discussed. Given pneumococcal 20 and flu vaccine today.  Return in 1 year or as needed.

## 2022-10-15 ENCOUNTER — Ambulatory Visit
Admission: RE | Admit: 2022-10-15 | Discharge: 2022-10-15 | Disposition: A | Discharge status: Home / Self Care | Payer: BC Managed Care – PPO | Source: Ambulatory Visit | Attending: Internal Medicine | Admitting: Internal Medicine

## 2023-03-02 ENCOUNTER — Other Ambulatory Visit: Payer: Self-pay | Admitting: Internal Medicine

## 2023-09-13 ENCOUNTER — Other Ambulatory Visit: Payer: Self-pay | Admitting: Internal Medicine

## 2023-11-30 ENCOUNTER — Other Ambulatory Visit: Payer: Self-pay | Admitting: Internal Medicine

## 2023-11-30 DIAGNOSIS — Z1231 Encounter for screening mammogram for malignant neoplasm of breast: Secondary | ICD-10-CM

## 2023-12-01 NOTE — Progress Notes (Incomplete)
 Annual Wellness Visit   Patient Care Team: Courtney Mackintosh, MD as PCP - General (Internal Medicine)  Visit Date: 12/02/23   Chief Complaint  Patient presents with   Annual Exam   Subjective:  Patient: Courtney Cole, Female DOB: Dec 31, 1970, 53 y.o. MRN: [id number] Courtney Cole is a 53 y.o. Female who presents today for her Annual Wellness Visit. Patient has history of  Allergic Rhinitis; Hyperlipidemia; Anxiety/Depression; History of Migraine Headaches; Attention Deficit Disorder; and Herpes Simplex Type 1 Infection.  History of Allergic Rhinitis with prior allergy testing by Dr. Beaulah Dinning in 2008; was allergic to mold and was thought to have an element of reflux. Subsequently was placed on PPI and treated with Albuterol inhaler, Astelin nasal spray and Nasonex spray. Spirometry was normal. No changes noted after using bronchodilator.  History of Hyperlipidemia treated with 20 mg Atorvastatin daily. Most recent Lipid Panel, 2023 with Cholesterol slightly elevated 215, HDL excellent at 83, Triglycerides low at 85 and LDL mildly elevated at 114.    History of HSV type I treated with 500 mg Valtrex daily.   History of Depression; ADD followed by Dr. Milagros Evener. Depression managed with 45 -105 mg Dextromethorphan-buPROPion ER BID, morning and night. According to prior notes, previously took Adderall, but does not currently.   Reports that she is currently helping to take care of her father with Dementia 3-4 days out of the week and notes that she hasn't been doing much, so has gained some weight. Last weight 07/24/2022 was 176, and today is 179. Expressed interest in Weight Loss clinic.   Labs collected 12/02/2023 CBC, CMP, Lipid Panel, TSH  History of Abnormal PAP Smear, 1996 s/p LEEP Procedure. Last completed 01/29/2020 found fungal organisms consistent w/ Candida spp., but was otherwise normal with repeat performed today.  Mammogram 10/15/2022 normal with repeat scheduled for  12/08/2023.  Colo-guard 2022 NEGATIVE with repeat recommendation of 2025. Discussed colonoscopy - she is agreeable to having 1 done for initial screening.   Bone Density won't be due until 2035.  Vaccine Counseling: Due for Flu, Covid-19, Shingles 1/2, and Tdap. UTD on Tdap (08/14/2022 [facility]). Past Medical History:  Diagnosis Date   Abnormal Pap smear    Anxiety    Depression    Herpes simplex virus type 1 (HSV-1) dermatitis    Personal history of kidney stones   Medical/Surgical History Narrative:  2023 - seen by [facility] for exposure to Insect sting/bite & Tick Bite in August with 2 cm x 2.5 cm area of erythema on right calf that has central abscess with clear drainage and 1 cm x 1 cm area on left big toe with abscess and erythema, no drainage on phys-exam treated with Doxycycline. In November was also seen in an UC after injuring her Right Index Finger by dropping a 25 LB weight onto it, causing a laceration tearing part of the skin overlying the DIP. XR Fingers normal; Cellulitis noted on phys-exam treated with Augmentin. Tdap updated that visit.     2020- In July received a Dog Bite to Face and had to be seen in the ED but recovered.   2016 -became Menopausal at age 78.   2006 - saw Dr. Zachery Conch, Neurologist for Migraine Headaches and was placed on Topamax but no longer takes it.   2000 - Remote Hx of Use Disorder: hospitalized with Alcohol/Drug Overdose in December. Upon discharge was referred to outpatient psychiatric program.  Family History  Problem Relation Age of Onset  Hypertension Mother    Hyperlipidemia Mother    Diabetes Mother    Kidney disease Mother    Breast cancer Mother 55   Anesthesia problems Neg Hx   Family History Narrative: Mother, w/ hx of Hypertension, Hyperlipidemia, Diabetes, Obesity, Breast Cancer, Urticaria, Depression, Renal Failure, and Dialysis, died of complications of COVID-19 early on during the pandemic.   Father, 38 y.o. w/  hx of Coronary Artery Disease, Mitral Regurgitation, and Dementia lives here in [place].      Social Hx: Divorced. No children. She has a Event organiser. Does not smoke. Years ago had issues with alcohol use. 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    Review of Systems  Constitutional:  Negative for chills, fever, malaise/fatigue and weight loss.  HENT:  Negative for hearing loss, sinus pain and sore throat.   Respiratory:  Negative for cough, hemoptysis and shortness of breath.   Cardiovascular:  Negative for chest pain, palpitations, leg swelling and PND.  Gastrointestinal:  Negative for abdominal pain, constipation, diarrhea, heartburn, nausea and vomiting.  Genitourinary:  Negative for dysuria, frequency and urgency.  Musculoskeletal:  Negative for back pain, myalgias and neck pain.  Skin:  Negative for itching and rash.  Neurological:  Negative for dizziness, tingling, seizures and headaches.  Endo/Heme/Allergies:  Negative for polydipsia.  Psychiatric/Behavioral:  Negative for depression. The patient is not nervous/anxious.     Objective:  Vitals: BP 120/77   Pulse 77   Ht 5' 3.5" (1.613 m)   Wt 179 lb (81.2 kg)   SpO2 97%   BMI 31.21 kg/m  Physical Exam Vitals and nursing note reviewed.  Constitutional:      General: She is not in acute distress.    Appearance: Normal appearance. She is not ill-appearing or toxic-appearing.  HENT:     Head: Normocephalic and atraumatic.     Right Ear: Hearing, tympanic membrane, ear canal and external ear normal.     Left Ear: Hearing, tympanic membrane, ear canal and external ear normal.     Mouth/Throat:     Pharynx: Oropharynx is clear.  Eyes:     Extraocular Movements: Extraocular movements intact.     Pupils: Pupils are equal, round, and reactive to light.  Neck:     Thyroid: No thyroid mass, thyromegaly or thyroid tenderness.     Vascular: No carotid bruit.  Cardiovascular:     Rate and Rhythm: Normal rate and regular  rhythm. No extrasystoles are present.    Pulses:          Dorsalis pedis pulses are 1+ on the right side and 1+ on the left side.     Heart sounds: Normal heart sounds. No murmur heard.    No friction rub. No gallop.  Pulmonary:     Effort: Pulmonary effort is normal.     Breath sounds: Normal breath sounds. No decreased breath sounds, wheezing, rhonchi or rales.  Chest:     Chest wall: No mass.  Breasts:    Right: Normal.     Left: No mass or tenderness.     Comments: Thickening noted of left upper outer quadrant.  Abdominal:     Palpations: Abdomen is soft. There is no hepatomegaly, splenomegaly or mass.     Tenderness: There is no abdominal tenderness.     Hernia: No hernia is present.  Genitourinary:    Adnexa: Right adnexa normal and left adnexa normal.     Rectum: Guaiac result positive.     Comments:  Bimanual pap normal Musculoskeletal:     Cervical back: Normal range of motion.     Right lower leg: No edema.     Left lower leg: No edema.  Lymphadenopathy:     Cervical: No cervical adenopathy.     Upper Body:     Right upper body: No supraclavicular adenopathy.     Left upper body: No supraclavicular adenopathy.  Skin:    General: Skin is warm and dry.  Neurological:     General: No focal deficit present.     Mental Status: She is alert and oriented to person, place, and time. Mental status is at baseline.     Sensory: Sensation is intact.     Motor: Motor function is intact. No weakness.     Deep Tendon Reflexes: Reflexes are normal and symmetric.  Psychiatric:        Attention and Perception: Attention normal.        Mood and Affect: Mood normal.        Speech: Speech normal.        Behavior: Behavior normal.        Thought Content: Thought content normal.        Cognition and Memory: Cognition normal.        Judgment: Judgment normal.  Most Recent Fall Risk Assessment:    07/24/2022   10:07 AM  Fall Risk   Falls in the past year? 0  Number falls in past  yr: 0  Injury with Fall? 0  Follow up Falls evaluation completed   Most Recent Depression Screenings:    12/02/2023   10:07 AM 07/24/2022   10:07 AM  PHQ 2/9 Scores  PHQ - 2 Score 6   PHQ- 9 Score 6   Exception Documentation  Other- indicate reason in comment box  Not completed  treated for depression   Results:  Studies Obtained And Personally Reviewed By Me:  PAP Smear 01/29/2020 found fungal organisms consistent w/ Candida spp.  Mammogram 10/15/2022 normal.  Colo-guard 2022 NEGATIVE.   Labs:     Component Value Date/Time   NA 138 07/23/2022 1044   K 4.6 07/23/2022 1044   CL 103 07/23/2022 1044   CO2 27 07/23/2022 1044   GLUCOSE 87 07/23/2022 1044   BUN 14 07/23/2022 1044   CREATININE 1.02 07/23/2022 1044   CALCIUM 10.4 07/23/2022 1044   PROT 7.8 07/23/2022 1044   ALBUMIN 4.4 11/27/2016 1030   AST 25 07/23/2022 1044   ALT 18 07/23/2022 1044   ALKPHOS 50 11/27/2016 1030   BILITOT 0.5 07/23/2022 1044   GFRNONAA 78 01/29/2020 1043   GFRAA 90 01/29/2020 1043    Lab Results  Component Value Date   WBC 4.1 07/23/2022   HGB 13.4 07/23/2022   HCT 39.7 07/23/2022   MCV 96.6 07/23/2022   PLT 319 07/23/2022   Lab Results  Component Value Date   CHOL 215 (H) 07/23/2022   HDL 83 07/23/2022   LDLCALC 114 (H) 07/23/2022   TRIG 85 07/23/2022   CHOLHDL 2.6 07/23/2022    Lab Results  Component Value Date   TSH 2.21 07/23/2022    Assessment & Plan:   Orders Placed This Encounter  Procedures   MM Digital Diagnostic Bilat   Ambulatory referral to Gastroenterology   POCT URINALYSIS DIP (CLINITEK)  Labs collected 12/02/2023 - CBC, CMP, Lipid Panel, TSH, PAP cytology  Allergic Rhinitis with prior allergy testing by Dr. Beaulah Dinning in 2008; was allergic to mold and was  treated with Albuterol inhaler, Astelin nasal spray and Nasonex spray. Spirometry was normal. No changes noted after using bronchodilator.  Hyperlipidemia treated with 20 mg Atorvastatin daily. Most recent  Lipid Panel, 2023 with Cholesterol slightly elevated 215, HDL excellent at 83, Triglycerides low at 85 and LDL mildly elevated at 114.    HSV - treated with 500 mg Valtrex daily. Refilled today.    Depression; ADD followed by Dr. Milagros Evener. Depression managed with 45 -105 mg Dextromethorphan-buPROPion ER BID, morning and night. According to prior notes, previously took Adderall, but does not currently.   BMI 31 Currently helping to take care of her father with Dementia 3-4 days out of the week and notes that she hasn't been doing much, so has gained some weight. Last weight 07/24/2022 was 176, and today is 179, BMI 31.21. Expressed interest in Weight Loss clinic.   PAP Smear 01/29/2020 found fungal organisms consistent w/ Candida spp., but was otherwise normal. Repeat performed today.  Mammogram 10/15/2022 normal with repeat scheduled for 12/08/2023.  Colo-guard 2022 NEGATIVE with repeat recommendation of 2025. Discussed colonoscopy - she is agreeable to having a colonoscopy done for initial screening, referral placed.   Situational stress taking care of elderly father. Discussed this today. She feels she has adequate support. Palliative Care will be coming to see him.  Bone Density won't be due until 2035.   Fasting labs drawn and results are pending    Annual wellness visit done today including the all of the following: Reviewed patient's Family Medical History Reviewed and updated list of patient's medical providers Assessment of cognitive impairment was done Assessed patient's functional ability Established a written schedule for health screening services Health Risk Assessent Completed and Reviewed  Discussed health benefits of physical activity, and encouraged her to engage in regular exercise appropriate for her age and condition.    I,Emily Lagle,acting as a Neurosurgeon for Courtney Mackintosh, MD.,have documented all relevant documentation on the behalf of Courtney Mackintosh, MD,as directed  by  Courtney Mackintosh, MD while in the presence of Courtney Mackintosh, MD.   I, Courtney Mackintosh, MD, have reviewed all documentation for this visit. The documentation on 12/02/23 for the exam, diagnosis, procedures, and orders are all accurate and complete.

## 2023-12-02 ENCOUNTER — Ambulatory Visit (INDEPENDENT_AMBULATORY_CARE_PROVIDER_SITE_OTHER): Payer: Self-pay | Admitting: Internal Medicine

## 2023-12-02 ENCOUNTER — Other Ambulatory Visit: Payer: 59

## 2023-12-02 ENCOUNTER — Encounter: Payer: Self-pay | Admitting: Internal Medicine

## 2023-12-02 ENCOUNTER — Other Ambulatory Visit (HOSPITAL_COMMUNITY)
Admission: RE | Admit: 2023-12-02 | Discharge: 2023-12-02 | Disposition: A | Discharge status: Home / Self Care | Source: Ambulatory Visit | Attending: Internal Medicine | Admitting: Internal Medicine

## 2023-12-02 VITALS — BP 120/77 | HR 77 | Ht 63.5 in | Wt 179.0 lb

## 2023-12-02 DIAGNOSIS — Z8619 Personal history of other infectious and parasitic diseases: Secondary | ICD-10-CM

## 2023-12-02 DIAGNOSIS — Z803 Family history of malignant neoplasm of breast: Secondary | ICD-10-CM

## 2023-12-02 DIAGNOSIS — Z1211 Encounter for screening for malignant neoplasm of colon: Secondary | ICD-10-CM

## 2023-12-02 DIAGNOSIS — E7800 Pure hypercholesterolemia, unspecified: Secondary | ICD-10-CM | POA: Diagnosis not present

## 2023-12-02 DIAGNOSIS — N6459 Other signs and symptoms in breast: Secondary | ICD-10-CM | POA: Diagnosis not present

## 2023-12-02 DIAGNOSIS — Z0000 Encounter for general adult medical examination without abnormal findings: Secondary | ICD-10-CM | POA: Diagnosis not present

## 2023-12-02 DIAGNOSIS — F439 Reaction to severe stress, unspecified: Secondary | ICD-10-CM

## 2023-12-02 DIAGNOSIS — Z6831 Body mass index (BMI) 31.0-31.9, adult: Secondary | ICD-10-CM

## 2023-12-02 DIAGNOSIS — Z124 Encounter for screening for malignant neoplasm of cervix: Secondary | ICD-10-CM

## 2023-12-02 DIAGNOSIS — F419 Anxiety disorder, unspecified: Secondary | ICD-10-CM

## 2023-12-02 DIAGNOSIS — F32A Depression, unspecified: Secondary | ICD-10-CM

## 2023-12-02 DIAGNOSIS — Z8669 Personal history of other diseases of the nervous system and sense organs: Secondary | ICD-10-CM | POA: Diagnosis not present

## 2023-12-02 DIAGNOSIS — Z13220 Encounter for screening for lipoid disorders: Secondary | ICD-10-CM

## 2023-12-02 LAB — POCT URINALYSIS DIP (CLINITEK)
Bilirubin, UA: NEGATIVE
Blood, UA: NEGATIVE
Glucose, UA: NEGATIVE mg/dL
Ketones, POC UA: NEGATIVE mg/dL
Leukocytes, UA: NEGATIVE
Nitrite, UA: NEGATIVE
POC PROTEIN,UA: NEGATIVE
Spec Grav, UA: 1.01 (ref 1.010–1.025)
Urobilinogen, UA: 0.2 U/dL
pH, UA: 6.5 (ref 5.0–8.0)

## 2023-12-02 MED ORDER — VALACYCLOVIR HCL 1 G PO TABS: ORAL_TABLET | ORAL | 2 refills | Status: DC

## 2023-12-02 NOTE — Patient Instructions (Addendum)
 Labs drawn today and results pending.Referral placed for screening colonoscopy. Please have mammogram soon. There is some breast thickening on physical exam. Need Shingrix vaccine which is available at local pharmacies.

## 2023-12-03 ENCOUNTER — Other Ambulatory Visit: Payer: Self-pay

## 2023-12-03 DIAGNOSIS — R718 Other abnormality of red blood cells: Secondary | ICD-10-CM

## 2023-12-05 LAB — CBC WITH DIFFERENTIAL/PLATELET
Absolute Lymphocytes: 1840 {cells}/uL (ref 850–3900)
Absolute Monocytes: 437 {cells}/uL (ref 200–950)
Basophils Absolute: 28 {cells}/uL (ref 0–200)
Basophils Relative: 0.6 %
Eosinophils Absolute: 28 {cells}/uL (ref 15–500)
Eosinophils Relative: 0.6 %
HCT: 39.3 % (ref 35.0–45.0)
Hemoglobin: 13 g/dL (ref 11.7–15.5)
MCH: 33.3 pg — ABNORMAL HIGH (ref 27.0–33.0)
MCHC: 33.1 g/dL (ref 32.0–36.0)
MCV: 100.8 fL — ABNORMAL HIGH (ref 80.0–100.0)
MPV: 10.4 fL (ref 7.5–12.5)
Monocytes Relative: 9.5 %
Neutro Abs: 2268 {cells}/uL (ref 1500–7800)
Neutrophils Relative %: 49.3 %
Platelets: 261 10*3/uL (ref 140–400)
RBC: 3.9 10*6/uL (ref 3.80–5.10)
RDW: 11.7 % (ref 11.0–15.0)
Total Lymphocyte: 40 %
WBC: 4.6 10*3/uL (ref 3.8–10.8)

## 2023-12-05 LAB — COMPLETE METABOLIC PANEL WITH GFR
AG Ratio: 1.6 (calc) (ref 1.0–2.5)
ALT: 16 U/L (ref 6–29)
AST: 20 U/L (ref 10–35)
Albumin: 4.7 g/dL (ref 3.6–5.1)
Alkaline phosphatase (APISO): 72 U/L (ref 37–153)
BUN: 14 mg/dL (ref 7–25)
CO2: 27 mmol/L (ref 20–32)
Calcium: 9.7 mg/dL (ref 8.6–10.4)
Chloride: 103 mmol/L (ref 98–110)
Creat: 0.85 mg/dL (ref 0.50–1.03)
Globulin: 2.9 g/dL (ref 1.9–3.7)
Glucose, Bld: 89 mg/dL (ref 65–99)
Potassium: 4.7 mmol/L (ref 3.5–5.3)
Sodium: 139 mmol/L (ref 135–146)
Total Bilirubin: 0.5 mg/dL (ref 0.2–1.2)
Total Protein: 7.6 g/dL (ref 6.1–8.1)
eGFR: 81 mL/min/{1.73_m2} (ref >=60)

## 2023-12-05 LAB — TEST AUTHORIZATION

## 2023-12-05 LAB — LIPID PANEL
Cholesterol: 186 mg/dL (ref <200)
HDL: 79 mg/dL (ref >=50)
LDL Cholesterol (Calc): 91 mg/dL
Non-HDL Cholesterol (Calc): 107 mg/dL (ref <130)
Total CHOL/HDL Ratio: 2.4 (calc) (ref <5.0)
Triglycerides: 71 mg/dL (ref <150)

## 2023-12-05 LAB — TSH: TSH: 1.17 m[IU]/L

## 2023-12-05 LAB — VITAMIN B12: Vitamin B-12: 510 pg/mL (ref 200–1100)

## 2023-12-05 LAB — FOLATE: Folate: 8.8 ng/mL

## 2023-12-06 LAB — CYTOLOGY - PAP
Comment: NEGATIVE
High risk HPV: NEGATIVE

## 2023-12-08 ENCOUNTER — Ambulatory Visit
Admission: RE | Admit: 2023-12-08 | Discharge: 2023-12-08 | Disposition: A | Discharge status: Home / Self Care | Payer: Self-pay | Source: Ambulatory Visit | Attending: Internal Medicine | Admitting: Internal Medicine

## 2023-12-08 DIAGNOSIS — Z1231 Encounter for screening mammogram for malignant neoplasm of breast: Secondary | ICD-10-CM

## 2023-12-09 ENCOUNTER — Other Ambulatory Visit: Payer: Self-pay | Admitting: Internal Medicine

## 2023-12-09 ENCOUNTER — Encounter: Payer: Self-pay | Admitting: Internal Medicine

## 2023-12-09 ENCOUNTER — Other Ambulatory Visit: Payer: Self-pay

## 2023-12-09 DIAGNOSIS — R234 Changes in skin texture: Secondary | ICD-10-CM

## 2023-12-09 DIAGNOSIS — Z1211 Encounter for screening for malignant neoplasm of colon: Secondary | ICD-10-CM

## 2023-12-09 DIAGNOSIS — N6459 Other signs and symptoms in breast: Secondary | ICD-10-CM

## 2023-12-09 DIAGNOSIS — F419 Anxiety disorder, unspecified: Secondary | ICD-10-CM

## 2023-12-09 DIAGNOSIS — Z8619 Personal history of other infectious and parasitic diseases: Secondary | ICD-10-CM

## 2023-12-09 DIAGNOSIS — Z6831 Body mass index (BMI) 31.0-31.9, adult: Secondary | ICD-10-CM

## 2023-12-09 DIAGNOSIS — E7800 Pure hypercholesterolemia, unspecified: Secondary | ICD-10-CM

## 2023-12-09 DIAGNOSIS — Z124 Encounter for screening for malignant neoplasm of cervix: Secondary | ICD-10-CM

## 2023-12-09 DIAGNOSIS — Z0000 Encounter for general adult medical examination without abnormal findings: Secondary | ICD-10-CM

## 2023-12-09 DIAGNOSIS — Z8669 Personal history of other diseases of the nervous system and sense organs: Secondary | ICD-10-CM

## 2023-12-09 DIAGNOSIS — Z803 Family history of malignant neoplasm of breast: Secondary | ICD-10-CM

## 2023-12-09 DIAGNOSIS — F439 Reaction to severe stress, unspecified: Secondary | ICD-10-CM

## 2023-12-21 ENCOUNTER — Encounter: Payer: Self-pay | Admitting: Pediatrics

## 2023-12-23 ENCOUNTER — Ambulatory Visit
Admission: RE | Admit: 2023-12-23 | Discharge: 2023-12-23 | Disposition: A | Discharge status: Home / Self Care | Source: Ambulatory Visit | Attending: Internal Medicine | Admitting: Internal Medicine

## 2023-12-23 DIAGNOSIS — R234 Changes in skin texture: Secondary | ICD-10-CM

## 2023-12-23 DIAGNOSIS — Z803 Family history of malignant neoplasm of breast: Secondary | ICD-10-CM

## 2024-01-05 ENCOUNTER — Ambulatory Visit (AMBULATORY_SURGERY_CENTER): Admitting: *Deleted

## 2024-01-05 VITALS — Ht 63.5 in | Wt 175.0 lb

## 2024-01-05 DIAGNOSIS — Z1211 Encounter for screening for malignant neoplasm of colon: Secondary | ICD-10-CM

## 2024-01-05 MED ORDER — SUFLAVE 178.7 G PO SOLR: 1.0000 | Freq: Once | ORAL | 0 refills | Status: AC

## 2024-01-05 NOTE — Progress Notes (Signed)
 Pt's name and DOB verified at the beginning of the pre-visit wit 2 identifiers  Pt denies any difficulty with ambulating,sitting, laying down or rolling side to side  Pt has no issues with ambulation   Pt has no issues moving head neck or swallowing  No egg or soy allergy known to patient   No issues known to pt with past sedation with any surgeries or procedures  Pt denies having issues being intubated  No FH of Malignant Hyperthermia  Pt is not on diet pills or shots  Pt is not on home 02   Pt is not on blood thinners   Pt has frequent issues with constipation RN instructed pt to use Miralax per bottles instructions a week before prep days. Pt states they will  Pt is not on dialysis  Pt denise any abnormal heart rhythms   Pt denies any upcoming cardiac testing  Patient's chart reviewed by Cathlyn Parsons CNRA prior to pre-visit and patient appropriate for the [facility].  Pre-visit completed and red dot placed by patient's name on their procedure day (on provider's schedule).    Chart not reviewed by CRNA prior to [facility]  Visit by phone  Pt states weight is 175 lb  IInstructions reviewed. Pt given [facility], [facility] main # and MD on call # prior to instructions.  Pt states understanding of instructions. Instructed to review again prior to procedure. Pt states they will.   Informed pt that they will receive a text or  call from [facility] regarding there prep med.

## 2024-01-25 ENCOUNTER — Encounter: Payer: Self-pay | Admitting: Pediatrics

## 2024-01-26 NOTE — Progress Notes (Unsigned)
 [facility] History and Physical   Primary Care Physician:  Sylvan Evener, MD   Reason for Procedure:  Colorectal cancer screening  Plan:    Screening colonoscopy     HPI: Courtney Cole is a 53 y.o. female undergoing screening colonoscopy for colorectal cancer screening.  This is the patient's first colonoscopy.  No family history of colorectal cancer or polyps.  Patient denies current symptoms of change in bowel habits or rectal bleeding.   Past Medical History:  Diagnosis Date   Abnormal Pap smear    ADD (attention deficit disorder)    Anxiety    Chronic kidney disease    stone   Depression    Herpes simplex virus type 1 (HSV-1) dermatitis    Hyperlipidemia    Migraines    Personal history of kidney stones     Past Surgical History:  Procedure Laterality Date   ADENOIDECTOMY     as child   ANTERIOR CERVICAL DECOMP/DISCECTOMY FUSION  02/11/2012   Procedure: ANTERIOR CERVICAL DECOMPRESSION/DISCECTOMY FUSION 2 LEVELS;  Surgeon: Manya Sells, MD;  Location: [facility];  Service: Neurosurgery;  Laterality: N/A;  Cervical Five-Six, Cervical Six-Seven anterior cervical decompression with fusion interbody prothesis plating and bonegraft   LEEP  1996   TUBAL LIGATION  2005    Prior to Admission medications   Medication Sig Start Date End Date Taking? Authorizing Provider  ALPRAZolam  (XANAX ) 1 MG tablet Take 1 mg by mouth at bedtime as needed. For sleep 11/05/11   [provider]  atorvastatin  (LIPITOR) 20 MG tablet TAKE 1 TABLET(20 MG) BY MOUTH DAILY 09/14/23   Sylvan Evener, MD  AUVELITY 45-105 MG TBCR Take by mouth. 12/20/23   [provider]  valACYclovir  (VALTREX ) 1000 MG tablet TAKE 1/2 TABLET(500 MG) BY MOUTH DAILY 12/02/23   Sylvan Evener, MD    Current Outpatient Medications  Medication Sig Dispense Refill   ALPRAZolam  (XANAX ) 1 MG tablet Take 1 mg by mouth at bedtime as needed. For sleep     atorvastatin  (LIPITOR) 20 MG tablet  TAKE 1 TABLET(20 MG) BY MOUTH DAILY 90 tablet 3   AUVELITY 45-105 MG TBCR Take by mouth.     valACYclovir  (VALTREX ) 1000 MG tablet TAKE 1/2 TABLET(500 MG) BY MOUTH DAILY 45 tablet 2   No current facility-administered medications for this visit.    Allergies as of 01/28/2024 - Review Complete 01/05/2024  Allergen Reaction Noted   Erythromycin Rash 02/09/2012    Family History  Problem Relation Age of Onset   Hypertension Mother    Hyperlipidemia Mother    Diabetes Mother    Kidney disease Mother    Breast cancer Mother 35   Anesthesia problems Neg Hx    Colon polyps Neg Hx    Colon cancer Neg Hx    Esophageal cancer Neg Hx    Rectal cancer Neg Hx    Stomach cancer Neg Hx     Social History   Socioeconomic History   Marital status: Divorced    Spouse name: Not on file   Number of children: Not on file   Years of education: Not on file   Highest education level: Not on file  Occupational History   Not on file  Tobacco Use   Smoking status: Never   Smokeless tobacco: Never  Substance and Sexual Activity   Alcohol use: Yes    Comment: rare   Drug use: No   Sexual activity: Yes    Birth control/protection: Surgical  Other Topics Concern   Not on file  Social History Narrative   Retired school principal, living in [place] and more recently lived in the [place] area. Has a masters degree. Divorced. No children. Non-smoker. Remote hx of alcohol abuse, but has remained in recovery and done well.   11/2023 - Currently helping take care of her father with Dementia, staying over 3-4 days/week. Does have at-home CNA a couple of days a week.   Social Drivers of Corporate investment banker Strain: Not on file  Food Insecurity: Not on file  Transportation Needs: Not on file  Physical Activity: Not on file  Stress: Not on file  Social Connections: Not on file  Intimate Partner Violence: Not on file    Review of Systems:  All other review of systems negative except as  mentioned in the HPI.  Physical Exam: Vital signs There were no vitals taken for this visit.  General:   Alert,  Well-developed, well-nourished, pleasant and cooperative in NAD Airway:  Mallampati 2 Lungs:  Clear throughout to auscultation.   Heart:  Regular rate and rhythm; no murmurs, clicks, rubs,  or gallops. Abdomen:  Soft, nontender and nondistended. Normal bowel sounds.   Neuro/Psych:  Normal mood and affect. A and O x 3  Eugenia Hess, MD [facility]

## 2024-01-28 ENCOUNTER — Ambulatory Visit: Admitting: Pediatrics

## 2024-01-28 ENCOUNTER — Encounter: Payer: Self-pay | Admitting: Pediatrics

## 2024-01-28 VITALS — BP 112/66 | HR 77 | Temp 98.6°F | Resp 13 | Ht 63.5 in | Wt 175.0 lb

## 2024-01-28 DIAGNOSIS — K635 Polyp of colon: Secondary | ICD-10-CM

## 2024-01-28 DIAGNOSIS — Z1211 Encounter for screening for malignant neoplasm of colon: Secondary | ICD-10-CM | POA: Diagnosis present

## 2024-01-28 DIAGNOSIS — D013 Carcinoma in situ of anus and anal canal: Secondary | ICD-10-CM

## 2024-01-28 DIAGNOSIS — D125 Benign neoplasm of sigmoid colon: Secondary | ICD-10-CM

## 2024-01-28 HISTORY — PX: COLONOSCOPY: SHX174

## 2024-01-28 MED ORDER — SODIUM CHLORIDE 0.9 % IV SOLN: 500.0000 mL | INTRAVENOUS | Status: DC

## 2024-01-28 NOTE — Progress Notes (Signed)
 Called to room to assist during endoscopic procedure.  Patient ID and intended procedure confirmed with present staff. Received instructions for my participation in the procedure from the performing physician.

## 2024-01-28 NOTE — Progress Notes (Signed)
 Sedate, gd SR, tolerated procedure well, VSS, report to RN

## 2024-01-28 NOTE — Patient Instructions (Addendum)
Resume previous diet Continue present medications Await pathology results  Handouts/information given for polyps.  YOU HAD AN ENDOSCOPIC PROCEDURE TODAY AT THE [facility]:   Refer to the procedure report that was given to you for any specific questions about what was found during the examination.  If the procedure report does not answer your questions, please call your gastroenterologist to clarify.  If you requested that your care partner not be given the details of your procedure findings, then the procedure report has been included in a sealed envelope for you to review at your convenience later.  YOU SHOULD EXPECT: Some feelings of bloating in the abdomen. Passage of more gas than usual.  Walking can help get rid of the air that was put into your GI tract during the procedure and reduce the bloating. If you had a lower endoscopy (such as a colonoscopy or flexible sigmoidoscopy) you may notice spotting of blood in your stool or on the toilet paper. If you underwent a bowel prep for your procedure, you may not have a normal bowel movement for a few days.  Please Note:  You might notice some irritation and congestion in your nose or some drainage.  This is from the oxygen used during your procedure.  There is no need for concern and it should clear up in a day or so.  SYMPTOMS TO REPORT IMMEDIATELY:  Following lower endoscopy (colonoscopy or flexible sigmoidoscopy):  Excessive amounts of blood in the stool  Significant tenderness or worsening of abdominal pains  Swelling of the abdomen that is new, acute  Fever of 100F or higher  For urgent or emergent issues, a gastroenterologist can be reached at any hour by calling [phone]. Do not use MyChart messaging for urgent concerns.    DIET:  We do recommend a small meal at first, but then you may proceed to your regular diet.  Drink plenty of fluids but you should avoid alcoholic beverages for 24 hours.  ACTIVITY:  You  should plan to take it easy for the rest of today and you should NOT DRIVE or use heavy machinery until tomorrow (because of the sedation medicines used during the test).    FOLLOW UP: Our staff will call the number listed on your records the next business day following your procedure.  We will call around 7:15- 8:00 am to check on you and address any questions or concerns that you may have regarding the information given to you following your procedure. If we do not reach you, we will leave a message.     If any biopsies were taken you will be contacted by phone or by letter within the next 1-3 weeks.  Please call us at [phone] if you have not heard about the biopsies in 3 weeks.    SIGNATURES/CONFIDENTIALITY: You and/or your care partner have signed paperwork which will be entered into your electronic medical record.  These signatures attest to the fact that that the information above on your After Visit Summary has been reviewed and is understood.  Full responsibility of the confidentiality of this discharge information lies with you and/or your care-partner.

## 2024-01-28 NOTE — Op Note (Signed)
  [facility] Patient Name: Courtney Cole Procedure Date: 01/28/2024 11:40 AM MRN: [id number] Endoscopist: Eugenia Hess , MD, [id number] Age: 53 Referring MD:  Date of Birth: 1970-11-10 Gender: Female Account #: [id number] Procedure:                Colonoscopy Indications:              Screening for colorectal malignant neoplasm, This                            is the patient's first colonoscopy Medicines:                Monitored Anesthesia Care Procedure:                Pre-Anesthesia Assessment:                           - Prior to the procedure, a History and Physical                            was performed, and patient medications and                            allergies were reviewed. The patient's tolerance of                            previous anesthesia was also reviewed. The risks                            and benefits of the procedure and the sedation                            options and risks were discussed with the patient.                            All questions were answered, and informed consent                            was obtained. Prior Anticoagulants: The patient has                            taken no anticoagulant or antiplatelet agents. ASA                            Grade Assessment: II - A patient with mild systemic                            disease. After reviewing the risks and benefits,                            the patient was deemed in satisfactory condition to                            undergo the procedure.  After obtaining informed consent, the colonoscope                            was passed under direct vision. Throughout the                            procedure, the patient's blood pressure, pulse, and                            oxygen saturations were monitored continuously. The                            CF [id number] #[id number] was introduced through the anus                            and advanced to  the cecum, identified by                            appendiceal orifice and ileocecal valve. The                            colonoscopy was performed without difficulty. The                            patient tolerated the procedure well. The quality                            of the bowel preparation was good. The ileocecal                            valve, appendiceal orifice, and rectum were                            photographed. Scope In: 11:51:21 AM Scope Out: 12:11:29 PM Scope Withdrawal Time: 0 hours 12 minutes 46 seconds  Total Procedure Duration: 0 hours 20 minutes 8 seconds  Findings:                 The perianal and digital rectal examinations were                            normal. Pertinent negatives include normal                            sphincter tone and no palpable rectal lesions.                           A 4 mm polyp was found in the sigmoid colon. The                            polyp was sessile. The polyp was removed with a                            cold biopsy forceps. Resection and retrieval were  complete.                           A 13 mm polypoid lesion was found in the distal                            rectum abutting the dentate line and beginning to                            extend into the anal canal. The lesion was flat.                            Biopsies were taken with a cold forceps for                            histology.                           Retroflexion of the rectum was otherwise normal. Complications:            No immediate complications. Estimated blood loss:                            Minimal. Estimated Blood Loss:     Estimated blood loss was minimal. Impression:               - One 4 mm polyp in the sigmoid colon, removed with                            a cold biopsy forceps. Resected and retrieved.                           - Polypoid lesion in the distal rectum. Biopsied. Recommendation:           -  Discharge patient to home (ambulatory).                           - Await pathology results. Pending pathology will                            determine referral for management of distal rectal                            lesion to advanced endoscopy versus colorectal                            surgery.                           - Repeat colonoscopy for surveillance based on                            pathology results.                           - The findings and recommendations were discussed  with the patient's family.                           - Return to referring physician.                           - Patient has a contact number available for                            emergencies. The signs and symptoms of potential                            delayed complications were discussed with the                            patient. Return to normal activities tomorrow.                            Written discharge instructions were provided to the                            patient. Eugenia Hess, MD 01/28/2024 12:19:54 PM This report has been signed electronically.

## 2024-01-28 NOTE — Progress Notes (Signed)
 Pt's states no medical or surgical changes since previsit or office visit.

## 2024-01-31 ENCOUNTER — Telehealth: Payer: Self-pay | Admitting: *Deleted

## 2024-01-31 NOTE — Telephone Encounter (Signed)
  Follow up Call-     01/28/2024   11:01 AM  Call back number  Post procedure Call Back phone  # [phone]  Permission to leave phone message Yes     Patient questions:  Do you have a fever, pain , or abdominal swelling? No. Pain Score  0 *  Have you tolerated food without any problems? Yes.    Have you been able to return to your normal activities? Yes.    Do you have any questions about your discharge instructions: Diet   No. Medications  No. Follow up visit  No.  Do you have questions or concerns about your Care? No.  Actions: * If pain score is 4 or above: No action needed, pain <4.

## 2024-02-02 ENCOUNTER — Encounter: Payer: Self-pay | Admitting: Pediatrics

## 2024-02-02 ENCOUNTER — Telehealth: Payer: Self-pay | Admitting: Pediatrics

## 2024-02-02 ENCOUNTER — Encounter: Payer: Self-pay | Admitting: Internal Medicine

## 2024-02-02 LAB — SURGICAL PATHOLOGY

## 2024-02-02 NOTE — Progress Notes (Signed)
 2. Surgical [P], colon, rectal polypoid lesion :       AT LEASE HIGH-GRADE SQUAMOUS INTRAEPITHELIAL LESION (HSIL / AIN 3) / SQUAMOUS       CARCINOMA IN SITU.       SUSPICIOUS FOR INVASION.

## 2024-02-02 NOTE — Telephone Encounter (Signed)
 I spoke on the telephone with Courtney Cole to review the results from her recent colonoscopy:     1. Surgical [P], colon, sigmoid, polyp (1) :       HYPERPLASTIC POLYP.       NEGATIVE FOR DYSPLASIA.        2. Surgical [P], colon, rectal polypoid lesion :       AT LEASE HIGH-GRADE SQUAMOUS INTRAEPITHELIAL LESION (HSIL / AIN 3) / SQUAMOUS       CARCINOMA IN SITU.       SUSPICIOUS FOR INVASION.   Reviewed that the anal lesion will require evaluation by colorectal surgery.  I will request my office placed a referral to [facility] surgery for further evaluation.  Reviewed that hyperplastic polyp is entirely benign and has no malignant potential.  Recall colonoscopy date will be dependent upon results of colorectal surgery evaluation.  All questions answered and [name] acknowledged understanding of our discussion.

## 2024-02-03 NOTE — Telephone Encounter (Signed)
 Urgent referral, records, demographics, and insurance information have been faxed to [facility] at [phone].  MyChart message sent to patient with referral information.

## 2024-02-23 ENCOUNTER — Encounter (HOSPITAL_COMMUNITY): Payer: Self-pay

## 2024-02-23 NOTE — Progress Notes (Signed)
 Surgical Instructions   Your procedure is scheduled on Wednesday, May 28th, 2025. Report to [facility] Main Entrance "A" at 5:30 A.M., then check in with the Admitting office. Any questions or running late day of surgery: call [phone]  Questions prior to your surgery date: call [phone], Monday-Friday, 8am-4pm. If you experience any cold or flu symptoms such as cough, fever, chills, shortness of breath, etc. between now and your scheduled surgery, please notify us  at the above number.     Remember:  Do not eat after midnight the night before your surgery   You may drink clear liquids until 4:30 the morning of your surgery.   Clear liquids allowed are: Water, Non-Citrus Juices (without pulp), Carbonated Beverages, Clear Tea (no milk, honey, etc.), Black Coffee Only (NO MILK, CREAM OR POWDERED CREAMER of any kind), and Gatorade.    Take these medicines the morning of surgery with A SIP OF WATER: Atorvastatin  (Lipitor) Auvelity Valacyclovir    May take these medicines IF NEEDED: Alprazolam  (Xanax )   Semaglutide needs to be held for at least 7 days prior to your surgery.  Your last dose should be on or before May 20th.     One week prior to surgery, STOP taking any Aspirin (unless otherwise instructed by your surgeon) Aleve, Naproxen, Ibuprofen, Motrin, Advil, Goody's, BC's, all herbal medications, fish oil, and non-prescription vitamins.                     Do NOT Smoke (Tobacco/Vaping) for 24 hours prior to your procedure.  If you use a CPAP at night, you may bring your mask/headgear for your overnight stay.   You will be asked to remove any contacts, glasses, piercing's, hearing aid's, dentures/partials prior to surgery. Please bring cases for these items if needed.    Patients discharged the day of surgery will not be allowed to drive home, and someone needs to stay with them for 24 hours.  SURGICAL WAITING ROOM VISITATION Patients may have no more than 2 support  people in the waiting area - these visitors may rotate.   Pre-op nurse will coordinate an appropriate time for 1 ADULT support person, who may not rotate, to accompany patient in pre-op.  Children under the age of 43 must have an adult with them who is not the patient and must remain in the main waiting area with an adult.  If the patient needs to stay at the hospital during part of their recovery, the visitor guidelines for inpatient rooms apply.  Please refer to the [facility] website for the visitor guidelines for any additional information.   If you received a COVID test during your pre-op visit  it is requested that you wear a mask when out in public, stay away from anyone that may not be feeling well and notify your surgeon if you develop symptoms. If you have been in contact with anyone that has tested positive in the last 10 days please notify you surgeon.      Pre-operative CHG Bathing Instructions   You can play a key role in reducing the risk of infection after surgery. Your skin needs to be as free of germs as possible. You can reduce the number of germs on your skin by washing with CHG (chlorhexidine gluconate) soap before surgery. CHG is an antiseptic soap that kills germs and continues to kill germs even after washing.   DO NOT use if you have an allergy to chlorhexidine/CHG or antibacterial soaps. If your  skin becomes reddened or irritated, stop using the CHG and notify one of our RNs at [phone].              TAKE A SHOWER THE NIGHT BEFORE SURGERY AND THE DAY OF SURGERY    Please keep in mind the following:  DO NOT shave, including legs and underarms, 48 hours prior to surgery.   You may shave your face before/day of surgery.  Place clean sheets on your bed the night before surgery Use a clean washcloth (not used since being washed) for each shower. DO NOT sleep with pet's night before surgery.  CHG Shower Instructions:  Wash your face and private area with normal  soap. If you choose to wash your hair, wash first with your normal shampoo.  After you use shampoo/soap, rinse your hair and body thoroughly to remove shampoo/soap residue.  Turn the water OFF and apply half the bottle of CHG soap to a CLEAN washcloth.  Apply CHG soap ONLY FROM YOUR NECK DOWN TO YOUR TOES (washing for 3-5 minutes)  DO NOT use CHG soap on face, private areas, open wounds, or sores.  Pay special attention to the area where your surgery is being performed.  If you are having back surgery, having someone wash your back for you may be helpful. Wait 2 minutes after CHG soap is applied, then you may rinse off the CHG soap.  Pat dry with a clean towel  Put on clean pajamas    Additional instructions for the day of surgery: DO NOT APPLY any lotions, deodorants, cologne, or perfumes.   Do not wear jewelry or makeup Do not wear nail polish, gel polish, artificial nails, or any other type of covering on natural nails (fingers and toes) Do not bring valuables to the hospital. [facility] is not responsible for valuables/personal belongings. Put on clean/comfortable clothes.  Please brush your teeth.  Ask your nurse before applying any prescription medications to the skin.

## 2024-02-23 NOTE — Progress Notes (Signed)
 PCP - Dr Zenon Hilda Baxley Cardiologist - none  Chest x-ray - n/a EKG - n/a Stress Test - n/a ECHO - n/a Cardiac Cath - n/a  ICD Pacemaker/Loop - n/a  Sleep Study -  n/a  Diabetes - n/a  Aspirin & Blood Thinner Instructions:  n/a  ERAS - clear liquids til 4:30 AM DOS  Anesthesia review: no  Do not take Semaglutide (weight loss) until after surgery.  Next dose will be on 03/04/24.  STOP now taking any Aspirin (unless otherwise instructed by your surgeon), Aleve, Naproxen, Ibuprofen, Motrin, Advil, Goody's, BC's, all herbal medications, fish oil, and all vitamins.   Coronavirus Screening Do you have any of the following symptoms:  Cough yes/no: No Fever (>100.41F)  yes/no: No Runny nose yes/no: No Sore throat yes/no: No Difficulty breathing/shortness of breath  yes/no: No  Have you traveled in the last 14 days and where? yes/no: No  Patient verbalized understanding of instructions that were given to them at the PAT appointment. Patient was also instructed that they will need to review over the PAT instructions again at home before surgery.

## 2024-02-24 ENCOUNTER — Encounter (HOSPITAL_COMMUNITY): Payer: Self-pay

## 2024-02-24 ENCOUNTER — Encounter (HOSPITAL_COMMUNITY)
Admission: RE | Admit: 2024-02-24 | Discharge: 2024-02-24 | Disposition: A | Discharge status: Home / Self Care | Source: Ambulatory Visit | Attending: Surgery | Admitting: Surgery

## 2024-02-24 ENCOUNTER — Other Ambulatory Visit: Payer: Self-pay

## 2024-02-24 DIAGNOSIS — Z01812 Encounter for preprocedural laboratory examination: Secondary | ICD-10-CM | POA: Diagnosis present

## 2024-02-24 DIAGNOSIS — Z01818 Encounter for other preprocedural examination: Secondary | ICD-10-CM

## 2024-02-24 HISTORY — DX: Personal history of urinary calculi: Z87.442

## 2024-02-24 LAB — CBC
HCT: 38.5 % (ref 36.0–46.0)
Hemoglobin: 13.4 g/dL (ref 12.0–15.0)
MCH: 33.8 pg (ref 26.0–34.0)
MCHC: 34.8 g/dL (ref 30.0–36.0)
MCV: 97 fL (ref 80.0–100.0)
Platelets: 244 10*3/uL (ref 150–400)
RBC: 3.97 MIL/uL (ref 3.87–5.11)
RDW: 11.9 % (ref 11.5–15.5)
WBC: 4.6 10*3/uL (ref 4.0–10.5)
nRBC: 0 % (ref 0.0–0.2)

## 2024-02-24 NOTE — Progress Notes (Signed)
 Surgical Instructions   Your procedure is scheduled on Wednesday, May 28th, 2025. Report to [facility] Main Entrance "A" at 5:30 A.M., then check in with the Admitting office. Any questions or running late day of surgery: call [phone]  Questions prior to your surgery date: call [phone], Monday-Friday, 8am-4pm. If you experience any cold or flu symptoms such as cough, fever, chills, shortness of breath, etc. between now and your scheduled surgery, please notify us  at the above number.     Remember:  Do not eat or drink after midnight the night before your surgery    Take these medicines the morning of surgery with A SIP OF WATER: Atorvastatin  (Lipitor) Auvelity Valacyclovir    May take these medicines IF NEEDED: Alprazolam  (Xanax )   Semaglutide needs to be held for at least 7 days prior to your surgery.  Your last dose should be on or before May 20th.     One week prior to surgery, STOP taking any Aspirin (unless otherwise instructed by your surgeon) Aleve, Naproxen, Ibuprofen, Motrin, Advil, Goody's, BC's, all herbal medications, fish oil, and non-prescription vitamins.                     Do NOT Smoke (Tobacco/Vaping) for 24 hours prior to your procedure.  If you use a CPAP at night, you may bring your mask/headgear for your overnight stay.   You will be asked to remove any contacts, glasses, piercing's, hearing aid's, dentures/partials prior to surgery. Please bring cases for these items if needed.    Patients discharged the day of surgery will not be allowed to drive home, and someone needs to stay with them for 24 hours.  SURGICAL WAITING ROOM VISITATION Patients may have no more than 2 support people in the waiting area - these visitors may rotate.   Pre-op nurse will coordinate an appropriate time for 1 ADULT support person, who may not rotate, to accompany patient in pre-op.  Children under the age of 36 must have an adult with them who is not the patient and  must remain in the main waiting area with an adult.  If the patient needs to stay at the hospital during part of their recovery, the visitor guidelines for inpatient rooms apply.  Please refer to the [facility] website for the visitor guidelines for any additional information.   If you received a COVID test during your pre-op visit  it is requested that you wear a mask when out in public, stay away from anyone that may not be feeling well and notify your surgeon if you develop symptoms. If you have been in contact with anyone that has tested positive in the last 10 days please notify you surgeon.      Pre-operative CHG Bathing Instructions   You can play a key role in reducing the risk of infection after surgery. Your skin needs to be as free of germs as possible. You can reduce the number of germs on your skin by washing with CHG (chlorhexidine gluconate) soap before surgery. CHG is an antiseptic soap that kills germs and continues to kill germs even after washing.   DO NOT use if you have an allergy to chlorhexidine/CHG or antibacterial soaps. If your skin becomes reddened or irritated, stop using the CHG and notify one of our RNs at [phone].              TAKE A SHOWER THE NIGHT BEFORE SURGERY AND THE DAY OF SURGERY  Please keep in mind the following:  DO NOT shave, including legs and underarms, 48 hours prior to surgery.   You may shave your face before/day of surgery.  Place clean sheets on your bed the night before surgery Use a clean washcloth (not used since being washed) for each shower. DO NOT sleep with pet's night before surgery.  CHG Shower Instructions:  Wash your face and private area with normal soap. If you choose to wash your hair, wash first with your normal shampoo.  After you use shampoo/soap, rinse your hair and body thoroughly to remove shampoo/soap residue.  Turn the water OFF and apply half the bottle of CHG soap to a CLEAN washcloth.  Apply CHG soap  ONLY FROM YOUR NECK DOWN TO YOUR TOES (washing for 3-5 minutes)  DO NOT use CHG soap on face, private areas, open wounds, or sores.  Pay special attention to the area where your surgery is being performed.  If you are having back surgery, having someone wash your back for you may be helpful. Wait 2 minutes after CHG soap is applied, then you may rinse off the CHG soap.  Pat dry with a clean towel  Put on clean pajamas    Additional instructions for the day of surgery: DO NOT APPLY any lotions, deodorants, cologne, or perfumes.   Do not wear jewelry or makeup Do not wear nail polish, gel polish, artificial nails, or any other type of covering on natural nails (fingers and toes) Do not bring valuables to the hospital. [facility] is not responsible for valuables/personal belongings. Put on clean/comfortable clothes.  Please brush your teeth.  Ask your nurse before applying any prescription medications to the skin.

## 2024-02-29 ENCOUNTER — Encounter (HOSPITAL_COMMUNITY): Payer: Self-pay | Admitting: Surgery

## 2024-02-29 NOTE — Anesthesia Preprocedure Evaluation (Signed)
 Anesthesia Evaluation  Patient identified by MRN, date of birth, ID band Patient awake    Reviewed: Allergy & Precautions, NPO status , Patient's Chart, lab work & pertinent test results  Airway Mallampati: I  TM Distance: >3 FB Neck ROM: Full    Dental no notable dental hx. (+) Teeth Intact, Dental Advisory Given   Pulmonary neg pulmonary ROS   Pulmonary exam normal breath sounds clear to auscultation       Cardiovascular negative cardio ROS Normal cardiovascular exam Rhythm:Regular Rate:Normal     Neuro/Psych  Headaches PSYCHIATRIC DISORDERS Anxiety Depression    ADD   GI/Hepatic Neg liver ROS,,,AIN 3   Endo/Other  GLP-1 RA therapy- last dose 5/17  Renal/GU Renal diseaseHx/o renal calculi     Musculoskeletal negative musculoskeletal ROS (+)    Abdominal   Peds  Hematology negative hematology ROS (+)   Anesthesia Other Findings   Reproductive/Obstetrics HSV                             Anesthesia Physical Anesthesia Plan  ASA: 2  Anesthesia Plan: General   Post-op Pain Management: Minimal or no pain anticipated and Dilaudid  IV   Induction: Intravenous  PONV Risk Score and Plan: 4 or greater and Treatment may vary due to age or medical condition, Midazolam , Ondansetron  and Dexamethasone   Airway Management Planned: Oral ETT  Additional Equipment: None  Intra-op Plan:   Post-operative Plan: Extubation in OR  Informed Consent: I have reviewed the patients History and Physical, chart, labs and discussed the procedure including the risks, benefits and alternatives for the proposed anesthesia with the patient or authorized representative who has indicated his/her understanding and acceptance.     Dental advisory given  Plan Discussed with: CRNA and Anesthesiologist  Anesthesia Plan Comments:         Anesthesia Quick Evaluation

## 2024-03-01 ENCOUNTER — Ambulatory Visit (HOSPITAL_COMMUNITY): Payer: Self-pay | Admitting: Anesthesiology

## 2024-03-01 ENCOUNTER — Other Ambulatory Visit: Payer: Self-pay

## 2024-03-01 ENCOUNTER — Encounter (HOSPITAL_COMMUNITY)
Admission: RE | Disposition: A | Discharge status: Home / Self Care | Payer: Self-pay | Source: Home / Self Care | Attending: Surgery

## 2024-03-01 ENCOUNTER — Ambulatory Visit (HOSPITAL_COMMUNITY)
Admission: RE | Admit: 2024-03-01 | Discharge: 2024-03-01 | Disposition: A | Discharge status: Home / Self Care | Attending: Surgery | Admitting: Surgery

## 2024-03-01 DIAGNOSIS — E785 Hyperlipidemia, unspecified: Secondary | ICD-10-CM | POA: Insufficient documentation

## 2024-03-01 DIAGNOSIS — C20 Malignant neoplasm of rectum: Secondary | ICD-10-CM | POA: Insufficient documentation

## 2024-03-01 DIAGNOSIS — D225 Melanocytic nevi of trunk: Secondary | ICD-10-CM | POA: Diagnosis not present

## 2024-03-01 DIAGNOSIS — Z01818 Encounter for other preprocedural examination: Secondary | ICD-10-CM

## 2024-03-01 DIAGNOSIS — D013 Carcinoma in situ of anus and anal canal: Secondary | ICD-10-CM | POA: Diagnosis present

## 2024-03-01 DIAGNOSIS — F419 Anxiety disorder, unspecified: Secondary | ICD-10-CM | POA: Insufficient documentation

## 2024-03-01 DIAGNOSIS — K6289 Other specified diseases of anus and rectum: Secondary | ICD-10-CM | POA: Diagnosis not present

## 2024-03-01 HISTORY — PX: RECTAL EXAM UNDER ANESTHESIA: SHX6399

## 2024-03-01 HISTORY — PX: TRANSANAL EXCISION OF RECTAL MASS: SHX6134

## 2024-03-01 LAB — HIV ANTIBODY (ROUTINE TESTING W REFLEX): HIV Screen 4th Generation wRfx: NONREACTIVE

## 2024-03-01 SURGERY — EXCISION, MASS, RECTUM, ANAL APPROACH
Anesthesia: General

## 2024-03-01 MED ORDER — BUPIVACAINE LIPOSOME 1.3 % IJ SUSP
INTRAMUSCULAR | Status: DC | PRN
  Administered 2024-03-01: 20 mL

## 2024-03-01 MED ORDER — BUPIVACAINE HCL (PF) 0.25 % IJ SOLN
INTRAMUSCULAR | Status: DC | PRN
  Administered 2024-03-01: 30 mL

## 2024-03-01 MED ORDER — PROPOFOL 10 MG/ML IV BOLUS
INTRAVENOUS | Status: AC
  Filled 2024-03-01: qty 20

## 2024-03-01 MED ORDER — HYDROMORPHONE HCL 1 MG/ML IJ SOLN
INTRAMUSCULAR | Status: AC
Start: 2024-03-01 — End: ?
  Filled 2024-03-01: qty 1

## 2024-03-01 MED ORDER — ONDANSETRON HCL 4 MG/2ML IJ SOLN
4.0000 mg | Freq: Once | INTRAMUSCULAR | Status: DC | PRN
Start: 2024-03-01 — End: 2024-03-01

## 2024-03-01 MED ORDER — 0.9 % SODIUM CHLORIDE (POUR BTL) OPTIME
TOPICAL | Status: DC | PRN
  Administered 2024-03-01: 1000 mL

## 2024-03-01 MED ORDER — CEFAZOLIN SODIUM 1 G IJ SOLR
INTRAMUSCULAR | Status: AC
  Filled 2024-03-01: qty 20

## 2024-03-01 MED ORDER — DEXAMETHASONE SODIUM PHOSPHATE 10 MG/ML IJ SOLN
INTRAMUSCULAR | Status: DC | PRN
  Administered 2024-03-01: 10 mg via INTRAVENOUS

## 2024-03-01 MED ORDER — FENTANYL CITRATE (PF) 250 MCG/5ML IJ SOLN
INTRAMUSCULAR | Status: AC
  Filled 2024-03-01: qty 5

## 2024-03-01 MED ORDER — SUGAMMADEX SODIUM 200 MG/2ML IV SOLN
INTRAVENOUS | Status: DC | PRN
  Administered 2024-03-01: 200 mg via INTRAVENOUS

## 2024-03-01 MED ORDER — OXYCODONE HCL 5 MG PO TABS: 5.0000 mg | ORAL_TABLET | Freq: Three times a day (TID) | ORAL | 0 refills | Status: AC | PRN

## 2024-03-01 MED ORDER — DROPERIDOL 2.5 MG/ML IJ SOLN
INTRAMUSCULAR | Status: AC
  Filled 2024-03-01: qty 2

## 2024-03-01 MED ORDER — ONDANSETRON HCL 4 MG/2ML IJ SOLN
INTRAMUSCULAR | Status: DC | PRN
  Administered 2024-03-01: 4 mg via INTRAVENOUS

## 2024-03-01 MED ORDER — OXYCODONE HCL 5 MG PO TABS: 5.0000 mg | ORAL_TABLET | Freq: Once | ORAL | Status: AC | PRN

## 2024-03-01 MED ORDER — LIDOCAINE 2% (20 MG/ML) 5 ML SYRINGE
INTRAMUSCULAR | Status: DC | PRN
  Administered 2024-03-01: 60 mg via INTRAVENOUS

## 2024-03-01 MED ORDER — PHENYLEPHRINE HCL-NACL 20-0.9 MG/250ML-% IV SOLN
INTRAVENOUS | Status: AC
Start: 2024-03-01 — End: ?
  Filled 2024-03-01: qty 500

## 2024-03-01 MED ORDER — FENTANYL CITRATE (PF) 250 MCG/5ML IJ SOLN
INTRAMUSCULAR | Status: DC | PRN
  Administered 2024-03-01 (×2): 100 ug via INTRAVENOUS

## 2024-03-01 MED ORDER — LACTATED RINGERS IV SOLN: INTRAVENOUS | Status: DC

## 2024-03-01 MED ORDER — HYDROMORPHONE HCL 1 MG/ML IJ SOLN
0.2500 mg | INTRAMUSCULAR | Status: DC | PRN
  Administered 2024-03-01: 0.25 mg via INTRAVENOUS

## 2024-03-01 MED ORDER — ORAL CARE MOUTH RINSE: 15.0000 mL | Freq: Once | OROMUCOSAL | Status: AC

## 2024-03-01 MED ORDER — DROPERIDOL 2.5 MG/ML IJ SOLN
0.6250 mg | Freq: Once | INTRAMUSCULAR | Status: AC | PRN
  Administered 2024-03-01: 0.625 mg via INTRAVENOUS

## 2024-03-01 MED ORDER — SODIUM CHLORIDE 0.9 % IV SOLN: 2.0000 g | Freq: Once | INTRAVENOUS | Status: DC

## 2024-03-01 MED ORDER — PROPOFOL 10 MG/ML IV BOLUS
INTRAVENOUS | Status: DC | PRN
  Administered 2024-03-01: 150 mg via INTRAVENOUS

## 2024-03-01 MED ORDER — PHENYLEPHRINE 80 MCG/ML (10ML) SYRINGE FOR IV PUSH (FOR BLOOD PRESSURE SUPPORT)
PREFILLED_SYRINGE | INTRAVENOUS | Status: DC | PRN
  Administered 2024-03-01 (×2): 80 ug via INTRAVENOUS

## 2024-03-01 MED ORDER — BUPIVACAINE LIPOSOME 1.3 % IJ SUSP
INTRAMUSCULAR | Status: AC
  Filled 2024-03-01: qty 20

## 2024-03-01 MED ORDER — MIDAZOLAM HCL 2 MG/2ML IJ SOLN
INTRAMUSCULAR | Status: DC | PRN
  Administered 2024-03-01: 2 mg via INTRAVENOUS

## 2024-03-01 MED ORDER — BUPIVACAINE HCL (PF) 0.25 % IJ SOLN
INTRAMUSCULAR | Status: AC
  Filled 2024-03-01: qty 30

## 2024-03-01 MED ORDER — OXYCODONE HCL 5 MG/5ML PO SOLN
5.0000 mg | Freq: Once | ORAL | Status: AC | PRN
  Administered 2024-03-01: 5 mg via ORAL

## 2024-03-01 MED ORDER — OXYCODONE HCL 5 MG PO TABS: 5.0000 mg | ORAL_TABLET | Freq: Three times a day (TID) | ORAL | 0 refills | Status: DC | PRN

## 2024-03-01 MED ORDER — LACTATED RINGERS IV SOLN
INTRAVENOUS | Status: DC | PRN
Start: 2024-03-01 — End: 2024-03-01

## 2024-03-01 MED ORDER — OXYCODONE HCL 5 MG/5ML PO SOLN
ORAL | Status: AC
Start: 2024-03-01 — End: ?
  Filled 2024-03-01: qty 5

## 2024-03-01 MED ORDER — ONDANSETRON HCL 4 MG PO TABS: 4.0000 mg | ORAL_TABLET | Freq: Three times a day (TID) | ORAL | 0 refills | Status: AC | PRN

## 2024-03-01 MED ORDER — MIDAZOLAM HCL 2 MG/2ML IJ SOLN
INTRAMUSCULAR | Status: AC
Start: 2024-03-01 — End: ?
  Filled 2024-03-01: qty 2

## 2024-03-01 MED ORDER — PROPOFOL 10 MG/ML IV BOLUS
INTRAVENOUS | Status: AC
Start: 2024-03-01 — End: ?
  Filled 2024-03-01: qty 20

## 2024-03-01 MED ORDER — SODIUM CHLORIDE 0.9 % IV SOLN
INTRAVENOUS | Status: DC | PRN
  Administered 2024-03-01: 2 g via INTRAVENOUS

## 2024-03-01 MED ORDER — ROCURONIUM BROMIDE 10 MG/ML (PF) SYRINGE
PREFILLED_SYRINGE | INTRAVENOUS | Status: DC | PRN
  Administered 2024-03-01: 70 mg via INTRAVENOUS

## 2024-03-01 MED ORDER — CHLORHEXIDINE GLUCONATE 0.12 % MT SOLN
15.0000 mL | Freq: Once | OROMUCOSAL | Status: AC
  Administered 2024-03-01: 15 mL via OROMUCOSAL
  Filled 2024-03-01: qty 15

## 2024-03-01 MED ORDER — EPHEDRINE SULFATE-NACL 50-0.9 MG/10ML-% IV SOSY
PREFILLED_SYRINGE | INTRAVENOUS | Status: DC | PRN
  Administered 2024-03-01: 10 mg via INTRAVENOUS

## 2024-03-01 SURGICAL SUPPLY — 30 items
BAG COUNTER SPONGE SURGICOUNT (BAG) ×2 IMPLANT
BLADE SURG 15 STRL LF DISP TIS (BLADE) ×2 IMPLANT
BRIEF MESH DISP LRG (UNDERPADS AND DIAPERS) ×2 IMPLANT
CANISTER SUCTION 3000ML PPV (SUCTIONS) ×2 IMPLANT
COVER SURGICAL LIGHT HANDLE (MISCELLANEOUS) ×2 IMPLANT
DISSECTOR SURG LIGASURE 21 (MISCELLANEOUS) IMPLANT
DRAPE LAPAROSCOPIC ABDOMINAL (DRAPES) IMPLANT
ELECT CAUTERY BLADE 6.4 (BLADE) ×2 IMPLANT
GAUZE PAD ABD 8X10 STRL (GAUZE/BANDAGES/DRESSINGS) ×2 IMPLANT
GAUZE SPONGE 4X4 12PLY STRL (GAUZE/BANDAGES/DRESSINGS) ×2 IMPLANT
GLOVE BIO SURGEON STRL SZ7.5 (GLOVE) ×2 IMPLANT
GLOVE INDICATOR 8.0 STRL GRN (GLOVE) ×2 IMPLANT
GOWN STRL REUS W/ TWL LRG LVL3 (GOWN DISPOSABLE) ×2 IMPLANT
GOWN STRL REUS W/ TWL XL LVL3 (GOWN DISPOSABLE) ×2 IMPLANT
KIT BASIN OR (CUSTOM PROCEDURE TRAY) ×2 IMPLANT
KIT TURNOVER KIT B (KITS) ×2 IMPLANT
NDL HYPO 25GX1X1/2 BEV (NEEDLE) ×2 IMPLANT
NEEDLE HYPO 25GX1X1/2 BEV (NEEDLE) ×1 IMPLANT
NS IRRIG 1000ML POUR BTL (IV SOLUTION) ×2 IMPLANT
PACK LITHOTOMY IV (CUSTOM PROCEDURE TRAY) ×2 IMPLANT
PAD ARMBOARD POSITIONER FOAM (MISCELLANEOUS) ×2 IMPLANT
PENCIL BUTTON HOLSTER BLD 10FT (ELECTRODE) ×2 IMPLANT
SPONGE T-LAP 4X18 ~~LOC~~+RFID (SPONGE) ×2 IMPLANT
SURGILUBE 2OZ TUBE FLIPTOP (MISCELLANEOUS) ×2 IMPLANT
SUT CHROMIC 3 0 SH 27 (SUTURE) ×2 IMPLANT
SUT VIC AB 2-0 UR6 27 (SUTURE) IMPLANT
SYR CONTROL 10ML LL (SYRINGE) ×2 IMPLANT
TOWEL GREEN STERILE (TOWEL DISPOSABLE) ×2 IMPLANT
TUBE CONNECTING 12X1/4 (SUCTIONS) ×2 IMPLANT
YANKAUER SUCT BULB TIP NO VENT (SUCTIONS) ×2 IMPLANT

## 2024-03-01 NOTE — H&P (Signed)
 CC: Here today for surgery  HPI: Courtney Cole is an 53 y.o. female with history of anxiety, HSV, HLD, whom is seen in the office today as a referral by Dr. Yvone Herd for evaluation of HGSIL versus carcinoma in situ, "suspicious for invasion"   Colonoscopy 01/28/24 wit Dr. Yvone Herd: - 4 mm polyp in the sigmoid colon removed - Polypoid lesion in distal rectum. Biopsied.  PATH 1. Surgical [P], colon, sigmoid, polyp (1) :  HYPERPLASTIC POLYP.  NEGATIVE FOR DYSPLASIA.   2. Surgical [P], colon, rectal polypoid lesion :  AT LEASE HIGH-GRADE SQUAMOUS INTRAEPITHELIAL LESION (HSIL / AIN 3) / SQUAMOUS  CARCINOMA IN SITU.  SUSPICIOUS FOR INVASION.   This was her first colonoscopy. This was done for screening purposes. She denied any symptoms preceding or following. She denies any history of anorectal pain or bleeding. She did have an abnormal Pap smear recently and has an appointment to see gynecology she states in 10 days.  She denies any changes in health or health history since we met in the office. No new medications/allergies. She states she is ready for surgery today.   PMH: anxiety, HSV, HLD  PSH: Denies any prior anorectal surgeries or procedures  FHx: Mother had breast cancer; otherwise, denies any known family history of colorectal, breast, endometrial or ovarian cancer  Social Hx: Denies use of tobacco/illicit drug. 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  Past Medical History:  Diagnosis Date   Abnormal Pap smear    ADD (attention deficit disorder)    Anxiety    Depression    Herpes simplex virus type 1 (HSV-1) dermatitis    History of kidney stones    passed stones   Hyperlipidemia    Migraines     Past Surgical History:  Procedure Laterality Date   ADENOIDECTOMY     as child   ANTERIOR CERVICAL  DECOMP/DISCECTOMY FUSION  02/11/2012   Procedure: ANTERIOR CERVICAL DECOMPRESSION/DISCECTOMY FUSION 2 LEVELS;  Surgeon: Manya Sells, MD;  Location: [facility];  Service: Neurosurgery;  Laterality: N/A;  Cervical Five-Six, Cervical Six-Seven anterior cervical decompression with fusion interbody prothesis plating and bonegraft   COLONOSCOPY  01/28/2024   LEEP  10/05/1994   TUBAL LIGATION  10/06/2003    Family History  Problem Relation Age of Onset   Hypertension Mother    Hyperlipidemia Mother    Diabetes Mother    Kidney disease Mother    Breast cancer Mother 13   Anesthesia problems Neg Hx    Colon polyps Neg Hx    Colon cancer Neg Hx    Esophageal cancer Neg Hx    Rectal cancer Neg Hx    Stomach cancer Neg Hx     Social:  reports that she has never smoked. She has never used smokeless tobacco. She reports current alcohol use. She reports that she does not use drugs.  Allergies:  Allergies  Allergen Reactions   Erythromycin Rash    Medications: I have reviewed the patient's current medications.  No results found for this or any previous visit (from the past 48 hours).  No results found.   PE Blood pressure 114/72, pulse 73, temperature 97.7 F (36.5 C), temperature source Oral, resp. rate 17, height 5\' 4"  (1.626 m), weight 74.8 kg, last menstrual period 07/04/2014, SpO2 95%. Constitutional: NAD; conversant Eyes:  Moist conjunctiva; no lid lag; anicteric Lungs: Normal respiratory effort CV: RRR Psychiatric: Appropriate affect  No results found for this or any previous visit (from the past 48 hours).  No results found.  A/P: Briggitte Boline is an 53 y.o. female with hx of anxiety, HSV, HLD here for evaluation of at least high-grade squamous intraepithelial lesion of the anal canal associated with some fairly subtle polypoid tissue in retroflexion position on colonoscopy  -Will order one-time HIV screening test  -The anatomy and physiology of the anal canal was  discussed with the patient with associated pictures. The pathophysiology of high grade squamous intraepithelial lesion versus carcinoma in situ versus invasive carcinoma of the anal canal was discussed at length with associated pictures and illustrations.  -We have reviewed options going forward, recommending surgery for further evaluation-transanal excision of distal rectal lesion; excision of perianal skin lesion; anorectal exam under anesthesia  -The planned procedure, material risks (including, but not limited to, pain, bleeding, infection, scarring, need for blood transfusion, damage to anal sphincter, incontinence of gas and/or stool, need for additional procedures, anal stenosis, rare cases of pelvic sepsis which in severe cases may require things like a colostomy, recurrence, pneumonia, heart attack, stroke, death) benefits and alternatives to surgery were discussed at length. I noted a good probability that the procedure would help improve their symptoms. The patient's questions were answered to her satisfaction, she voiced understanding and elected to proceed with surgery. Additionally, we discussed typical postoperative expectations and the recovery process.  -We discussed that based upon pathology findings, there is also the possibility of additional treatment including other procedures and/or possibility of something else like chemoradiation again largely based on pathologic findings and time of surgery findings.  - Continue follow-up with gynecology as already scheduled   Beatris Lincoln, MD [facility]

## 2024-03-01 NOTE — Op Note (Signed)
 03/01/2024  8:50 AM  PATIENT:  Courtney Cole  53 y.o. female  Patient Care Team: Sylvan Evener, MD as PCP - General (Internal Medicine)  PRE-OPERATIVE DIAGNOSIS:  Distal rectal lesion Perianal/gluteal lesion  POST-OPERATIVE DIAGNOSIS:  Left posterior distal rectal lesion Left gluteal skin lesion  PROCEDURE:   1.  Transanal excision of distal rectal lesion (full thickness) - 2 x 2 cm 2.  Excision of perianal/gluteal lesion - 5 x 5 mm 3.  Anorectal exam under anesthesia   SURGEON:  Surgeon(s): Melvenia Stabs, MD  ASSISTANT: OR Staff   ANESTHESIA:   local and general  SPECIMEN:   Left posterior distal rectal lesion - blue stitch distal, black stitch medial Left gluteal skin lesion  DISPOSITION OF SPECIMEN:  PATHOLOGY  COUNTS:  Sponge, needle, and instrument counts were reported correct x2 at conclusion.  EBL: 10 mL  Drains: None  PLAN OF CARE: Discharge to home after PACU  PATIENT DISPOSITION:  PACU - hemodynamically stable.  OR FINDINGS: Left posterior encroaching the posterior midline distal rectal lesion which previously been biopsied and was concerning for potential malignancy.  This was fully excised with grossly negative appearing margins.  This was a full-thickness type excision.  Left perianal/gluteal skin lesion also excised.  No other notable findings on circumferential anoscopy.  DESCRIPTION: The patient was identified in the preoperative holding area and taken to the OR. SCDs were applied.  She then underwent general endotracheal anesthesia without difficulty. The patient was then rolled onto the OR table in the prone jackknife position. Pressure points were then evaluated and padded. Benzoin was applied to the buttocks and they were gently taped apart.  She was then prepped and draped in usual sterile fashion.  A surgical timeout was performed indicating the correct patient, procedure, and positioning.  A perianal block was then created using a  dilute mixture of 0.25% Marcaine  with epinephrine  and Exparel .  After ascertaining an appropriate level of anesthesia had been achieved, a well lubricated digital rectal exam was performed. This demonstrated no clearly palpable masses.  A Hill-Ferguson anoscope was into the anal canal and circumferential inspection demonstrated healthy appearing anoderm.  There is nodular-like mucosal changes in the posterior midline encroaching on the left posterior position.  This appears consistent with what was seen endoscopically.  Meticulous inspection of the distal rectum demonstrates no other notable findings.  Externally, there is a known skin lesion in the left posterior position on the buttock.  Intentions were made for a full-thickness type excision of this distal rectal lesion with the anoscope in place.  This extends from the distal rectum all way down to the level of the dentate line.  Borders of excision are marked circumferentially including healthy tissue around it.  There is no gross involvement of any of these margins.  We began our resection by incising distally first.  The anoderm is dissected free of the underlying internal sphincter muscle.  This is converted to a full-thickness type excision above the level of the internal sphincter going down into perirectal fat.  This lesion measures approximately 2 x 2 cm in size.  It is friable.  A full-thickness type excision was completed and specimen orientation is maintained.  It is oriented with a blue Prolene stitch on the distal most aspect and a black silk stitch on the medial side of the specimen.  This was passed off for pathology.  Hemostasis was achieved electrocautery.  The defect is inspected.  We are able to complete a  tension-free type repair closing it transversely which also minimizes any sort of stenosis of the anal canal.  This was done using interrupted 2-0 Vicryl sutures.  The defect closure was inspected and complete.  It is hemostatic.   Additional local anesthetic is infiltrated the excision site.  Attention was then directed to the left posterior gluteal lesion.  This measures 5 x 5 mm in size.  Is elevated with a forcep and then excised sharply using a scalpel.  Hemostasis was achieved electrocautery.  A single 3-0 chromic stitch was placed to approximate the skin at this location.  The anal canal was reinspected.  Hemostasis verified.  All sponge, needle, and instrument counts were reported correct.  Ultimately, the buttocks are untaped and a dressing consisting of 4 x 4's, ABD, and mesh underwear was placed.  She was rolled back onto a stretcher, awakened from anesthesia, extubated, and transported to recovery in satisfactory condition.  DISPOSITION: PACU in satisfactory condition.

## 2024-03-01 NOTE — Anesthesia Postprocedure Evaluation (Signed)
 Anesthesia Post Note  Patient: Engineer, maintenance (IT)  Procedure(s) Performed: TRANSANAL EXCISION OF DISTAL RECTAL MASS; EXCISION OF GLUTEAL/PERIANAL SKIN TAG EXAM UNDER ANESTHESIA, RECTUM     Patient location during evaluation: PACU Anesthesia Type: General Level of consciousness: awake and alert and oriented Pain management: pain level controlled Vital Signs Assessment: post-procedure vital signs reviewed and stable Respiratory status: spontaneous breathing, nonlabored ventilation and respiratory function stable Cardiovascular status: blood pressure returned to baseline and stable Postop Assessment: no apparent nausea or vomiting Anesthetic complications: no   There were no known notable events for this encounter.  Last Vitals:  Vitals:   03/01/24 0915 03/01/24 0926  BP: 105/70 102/74  Pulse: 70 78  Resp: 14 13  Temp:    SpO2: 94% 94%    Last Pain:  Vitals:   03/01/24 0850  TempSrc:   PainSc: Asleep                 Threasa Kinch A.

## 2024-03-01 NOTE — Discharge Instructions (Signed)

## 2024-03-01 NOTE — Progress Notes (Signed)
 Wasted .75 mgs hydromorphone .  Crecencio Dodge, RN

## 2024-03-01 NOTE — Anesthesia Procedure Notes (Signed)
 Procedure Name: Intubation Date/Time: 03/01/2024 7:39 AM  Performed by: Loreda Rodriguez, CRNAPre-anesthesia Checklist: Patient identified, Emergency Drugs available, Suction available and Patient being monitored Patient Re-evaluated:Patient Re-evaluated prior to induction Oxygen Delivery Method: Circle System Utilized Preoxygenation: Pre-oxygenation with 100% oxygen Induction Type: IV induction Ventilation: Mask ventilation without difficulty Laryngoscope Size: Mac and 3 Grade View: Grade I Tube type: Oral Tube size: 7.0 mm Number of attempts: 1 Airway Equipment and Method: Stylet and Oral airway Placement Confirmation: ETT inserted through vocal cords under direct vision, positive ETCO2 and breath sounds checked- equal and bilateral Secured at: 21 cm Tube secured with: Tape Dental Injury: Teeth and Oropharynx as per pre-operative assessment

## 2024-03-01 NOTE — Transfer of Care (Signed)
 Immediate Anesthesia Transfer of Care Note  Patient: Courtney Cole  Procedure(s) Performed: TRANSANAL EXCISION OF DISTAL RECTAL MASS; EXCISION OF GLUTEAL/PERIANAL SKIN TAG EXAM UNDER ANESTHESIA, RECTUM  Patient Location: PACU  Anesthesia Type:General  Level of Consciousness: awake, alert , and oriented  Airway & Oxygen Therapy: Patient Spontanous Breathing and Patient connected to nasal cannula oxygen  Post-op Assessment: Report given to RN and Post -op Vital signs reviewed and stable  Post vital signs: Reviewed and stable  Last Vitals:  Vitals Value Taken Time  BP 107/74 03/01/24 0849  Temp 97   Pulse 83 03/01/24 0851  Resp 15 03/01/24 0851  SpO2 81 % 03/01/24 0851  Vitals shown include unfiled device data.  Last Pain:  Vitals:   03/01/24 0631  TempSrc:   PainSc: 2       Patients Stated Pain Goal: 2 (03/01/24 0631)  Complications: No notable events documented.

## 2024-03-02 ENCOUNTER — Encounter (HOSPITAL_COMMUNITY): Payer: Self-pay | Admitting: Surgery

## 2024-03-02 ENCOUNTER — Ambulatory Visit: Payer: Self-pay | Admitting: Internal Medicine

## 2024-03-02 LAB — SURGICAL PATHOLOGY

## 2024-03-02 NOTE — Progress Notes (Signed)
 Opened in error

## 2024-03-08 ENCOUNTER — Other Ambulatory Visit: Payer: Self-pay

## 2024-03-09 NOTE — Progress Notes (Signed)
 The proposed treatment discussed in conference is for discussion purpose only and is not a binding recommendation.  The patients have not been physically examined, or presented with their treatment options.  Therefore, final treatment plans cannot be decided.

## 2024-07-10 ENCOUNTER — Other Ambulatory Visit: Payer: Self-pay | Admitting: Internal Medicine

## 2024-07-15 ENCOUNTER — Other Ambulatory Visit: Payer: Self-pay | Admitting: Internal Medicine

## 2024-08-25 ENCOUNTER — Ambulatory Visit: Payer: Self-pay

## 2024-08-25 NOTE — Telephone Encounter (Signed)
 Patient/caller refused triage.  Reason for refusal: only wants to speak to PCP.  Copied from CRM [id number]. Topic: Clinical - Red Word Triage >> Aug 25, 2024  1:06 PM Shanda MATSU wrote: Red Word that prompted transfer to Nurse Triage: patient is reporting problems with breathing, patient stated that she is currently getting over COVD.

## 2024-12-04 ENCOUNTER — Encounter: Admitting: Internal Medicine
